# Patient Record
Sex: Female | Born: 1970 | Race: White | Hispanic: No | Marital: Married | Smoking: Never smoker
Health system: Southern US, Community
[De-identification: ages and names within clinical notes are randomized; demographics above are authoritative.]

## PROBLEM LIST (undated history)

## (undated) DIAGNOSIS — N809 Endometriosis, unspecified: Secondary | ICD-10-CM

---

## 2009-10-06 ENCOUNTER — Ambulatory Visit: Payer: Self-pay | Admitting: Internal Medicine

## 2017-08-12 ENCOUNTER — Emergency Department
Admission: EM | Admit: 2017-08-12 | Discharge: 2017-08-12 | Disposition: A | Discharge status: Home / Self Care | Payer: 59 | Attending: Emergency Medicine | Admitting: Emergency Medicine

## 2017-08-12 ENCOUNTER — Emergency Department: Payer: 59

## 2017-08-12 DIAGNOSIS — N809 Endometriosis, unspecified: Secondary | ICD-10-CM | POA: Insufficient documentation

## 2017-08-12 DIAGNOSIS — R109 Unspecified abdominal pain: Secondary | ICD-10-CM

## 2017-08-12 DIAGNOSIS — M545 Low back pain: Secondary | ICD-10-CM | POA: Diagnosis present

## 2017-08-12 DIAGNOSIS — R319 Hematuria, unspecified: Secondary | ICD-10-CM | POA: Insufficient documentation

## 2017-08-12 DIAGNOSIS — Q799 Congenital malformation of musculoskeletal system, unspecified: Secondary | ICD-10-CM | POA: Diagnosis not present

## 2017-08-12 HISTORY — DX: Endometriosis, unspecified: N80.9

## 2017-08-12 LAB — CBC
HEMATOCRIT: 40.6 % (ref 35.0–47.0)
HEMOGLOBIN: 14.4 g/dL (ref 12.0–16.0)
MCH: 30.4 pg (ref 26.0–34.0)
MCHC: 35.4 g/dL (ref 32.0–36.0)
MCV: 85.9 fL (ref 80.0–100.0)
Platelets: 237 10*3/uL (ref 150–440)
RBC: 4.73 MIL/uL (ref 3.80–5.20)
RDW: 12.7 % (ref 11.5–14.5)
WBC: 4.7 10*3/uL (ref 3.6–11.0)

## 2017-08-12 LAB — URINALYSIS, COMPLETE (UACMP) WITH MICROSCOPIC
BACTERIA UA: NONE SEEN
Bilirubin Urine: NEGATIVE
Glucose, UA: NEGATIVE mg/dL
KETONES UR: NEGATIVE mg/dL
Leukocytes, UA: NEGATIVE
Nitrite: NEGATIVE
Protein, ur: NEGATIVE mg/dL
SPECIFIC GRAVITY, URINE: 1.025 (ref 1.005–1.030)
pH: 5 (ref 5.0–8.0)

## 2017-08-12 LAB — BASIC METABOLIC PANEL
ANION GAP: 7 (ref 5–15)
BUN: 16 mg/dL (ref 6–20)
CO2: 27 mmol/L (ref 22–32)
Calcium: 9.3 mg/dL (ref 8.9–10.3)
Chloride: 103 mmol/L (ref 101–111)
Creatinine, Ser: 1.12 mg/dL — ABNORMAL HIGH (ref 0.44–1.00)
GFR calc Af Amer: >60 mL/min (ref >60)
GFR, EST NON AFRICAN AMERICAN: 58 mL/min — AB (ref >60)
GLUCOSE: 89 mg/dL (ref 65–99)
POTASSIUM: 4.6 mmol/L (ref 3.5–5.1)
Sodium: 137 mmol/L (ref 135–145)

## 2017-08-12 LAB — GLUCOSE, CAPILLARY: GLUCOSE-CAPILLARY: 90 mg/dL (ref 65–99)

## 2017-08-12 LAB — POCT PREGNANCY, URINE: Preg Test, Ur: NEGATIVE

## 2017-08-12 MED ORDER — KETOROLAC TROMETHAMINE 30 MG/ML IJ SOLN
30.0000 mg | Freq: Once | INTRAMUSCULAR | Status: AC
  Administered 2017-08-12: 30 mg via INTRAVENOUS
  Filled 2017-08-12: qty 1

## 2017-08-12 MED ORDER — IBUPROFEN 200 MG PO TABS: 600.0000 mg | ORAL_TABLET | Freq: Four times a day (QID) | ORAL | 0 refills | Status: DC | PRN

## 2017-08-12 MED ORDER — SODIUM CHLORIDE 0.9 % IV BOLUS (SEPSIS)
1000.0000 mL | Freq: Once | INTRAVENOUS | Status: AC
  Administered 2017-08-12: 1000 mL via INTRAVENOUS

## 2017-08-12 MED ORDER — POLYETHYLENE GLYCOL 3350 17 G PO PACK: 17.0000 g | PACK | Freq: Every day | ORAL | 0 refills | Status: DC

## 2017-08-12 MED ORDER — ONDANSETRON HCL 4 MG PO TABS: 4.0000 mg | ORAL_TABLET | Freq: Every day | ORAL | 0 refills | Status: DC | PRN

## 2017-08-12 MED ORDER — ONDANSETRON HCL 4 MG/2ML IJ SOLN
4.0000 mg | Freq: Once | INTRAMUSCULAR | Status: AC
  Administered 2017-08-12: 4 mg via INTRAVENOUS
  Filled 2017-08-12: qty 2

## 2017-08-12 MED ORDER — OXYCODONE-ACETAMINOPHEN 5-325 MG PO TABS: 1.0000 | ORAL_TABLET | Freq: Four times a day (QID) | ORAL | 0 refills | Status: AC | PRN

## 2017-08-12 NOTE — ED Triage Notes (Signed)
Pt states that she is having left sided mid back pain and right sided lower back pain X 2 days. No injury. Intermittent nausea and dizziness that started when the pain began. Blood in urine noted by patient. Pt alert and oriented X4, active, cooperative, pt in NAD. RR even and unlabored, color WNL.

## 2017-08-12 NOTE — ED Notes (Signed)
Patient transported to CT 

## 2017-08-12 NOTE — ED Provider Notes (Addendum)
[facility] Emergency Department Provider Note  ____________________________________________   I have reviewed the triage vital signs and the nursing notes.   HISTORY  Chief Complaint Back Pain    HPI Meagan Leach is a 46 y.o. female w a considerablet family history of renal colic, who also suffers from endometriosis complains of focal left-sided back pain associated with dry heaving "from the pain" which began over the last couple days gradually become much worse this morning. No fever. Had hematuria. Denies any vaginal discharge, is not on her menstrual period, denies any other associated symptoms. Pain is sharp, radiates around sometimes towards her groin but mostly stays just in the back. Denies abdominal pain. No other associated symptoms normal bowel movements. No prior treatments.      Past Medical History:  Diagnosis Date  . Endometriosis     There are no active problems to display for this patient.   History reviewed. No pertinent surgical history.  Prior to Admission medications   Not on File    Allergies Penicillins  No family history on file.  Social History Social History  Substance Use Topics  . Smoking status: Never Smoker  . Smokeless tobacco: Not on file  . Alcohol use No    Review of Systems Constitutional: No fever/chills Eyes: No visual changes. ENT: No sore throat. No stiff neck no neck pain Cardiovascular: Denies chest pain. Respiratory: Denies shortness of breath. Gastrointestinal:   plus "dry heaving from pain".  No diarrhea.  No constipation. Genitourinary: Negative for dysuria. Musculoskeletal: Negative lower extremity swelling Skin: Negative for rash. Neurological: Negative for severe headaches, focal weakness or numbness.   ____________________________________________   PHYSICAL EXAM:  VITAL SIGNS: ED Triage Vitals [08/12/17 1215]  Enc Vitals Group     BP 135/82     Pulse Rate 75     Resp 18      Temp 98.4 F (36.9 C)     Temp Source Oral     SpO2 98 %     Weight 135 lb (61.2 kg)     Height 5' (1.524 m)     Head Circumference      Peak Flow      Pain Score 6     Pain Loc      Pain Edu?      Excl. in [place]?     Constitutional: Alert and oriented. Well appearing and in no acute distress. Eyes: Conjunctivae are normal Head: Atraumatic HEENT: No congestion/rhinnorhea. Mucous membranes are moist.  Oropharynx non-erythematous Neck:   Nontender with no meningismus, no masses, no stridor Cardiovascular: Normal rate, regular rhythm. Grossly normal heart sounds.  Good peripheral circulation. Respiratory: Normal respiratory effort.  No retractions. Lungs CTAB. Abdominal: Soft and nontender. No distention. No guarding no rebound Back:  There is no focal tenderness or step off.  there is no midline tenderness there are no lesions noted. there is significant left CVA tenderness Musculoskeletal: No lower extremity tenderness, no upper extremity tenderness. No joint effusions, no DVT signs strong distal pulses no edema Neurologic:  Normal speech and language. No gross focal neurologic deficits are appreciated.  Skin:  Skin is warm, dry and intact. No rash noted. Psychiatric: Mood and affect are normal. Speech and behavior are normal.  ____________________________________________   LABS (all labs ordered are listed, but only abnormal results are displayed)  Labs Reviewed  BASIC METABOLIC PANEL - Abnormal; Notable for the following:       Result Value   Creatinine, Ser  1.12 (*)    GFR calc non Af Amer 58 (*)    All other components within normal limits  URINALYSIS, COMPLETE (UACMP) WITH MICROSCOPIC - Abnormal; Notable for the following:    Color, Urine YELLOW (*)    APPearance HAZY (*)    Hgb urine dipstick MODERATE (*)    Squamous Epithelial / LPF 0-5 (*)    All other components within normal limits  CBC  GLUCOSE, CAPILLARY  CBG MONITORING, ED  POC URINE PREG, ED  POCT  PREGNANCY, URINE    Pertinent labs  results that were available during my care of the patient were reviewed by me and considered in my medical decision making (see chart for details). ____________________________________________  EKG  I personally interpreted any EKGs ordered by me or triage  ____________________________________________  RADIOLOGY  Pertinent labs & imaging results that were available during my care of the patient were reviewed by me and considered in my medical decision making (see chart for details). If possible, patient and/or family made aware of any abnormal findings. ____________________________________________    PROCEDURES  Procedure(s) performed: None  Procedures  Critical Care performed: None  ____________________________________________   INITIAL IMPRESSION / ASSESSMENT AND PLAN / ED COURSE  Pertinent labs & imaging results that were available during my care of the patient were reviewed by me and considered in my medical decision making (see chart for details).  patient with hematuria, focal left flank pain which is significant, family history of kidney stones most likely this is a kidney stone differential does exist, don't feel ectopic pregnancy or ovarian cyst or likely given symptoms, pyelonephritis seems to be ruled out mostly by urinalysis and other findings but we will evaluate further with imaging   ----------------------------------------- 3:53 PM on 08/12/2017 -----------------------------------------  patient is pain-free after Toradol, she has hematuria, slightly increased creatinine, and flank pain all of which are consistent with kidney stone, she may have passed a small stone or she may have a stone 2 small see on CT but there is no other obvious pathology noted. Certainly no abdominal tenderness on serial exams. I did offer her pelvic exam which she declines. I don't think is unreasonable. She understands the limitations places upon  the workup. A shin was made aware of findings on CT suggesting at least unknown sclerotic pathology in the left elbow region and the need for outpatient MRI which she states she will pursue, she is laughing and joking in no acute distress extensive return precautions given and understood,  Made aware of stool burden on ct.     ____________________________________________   FINAL CLINICAL IMPRESSION(S) / ED DIAGNOSES  Final diagnoses:  None      This chart was dictated using voice recognition software.  Despite best efforts to proofread,  errors can occur which can change meaning.      Schuyler Amor, MD 08/12/17 1435    Schuyler Amor, MD 08/12/17 1554    Schuyler Amor, MD 08/12/17 [phone]

## 2017-08-12 NOTE — Discharge Instructions (Signed)
At this time, the most likely diagnosis is that you're either passing a small kidney stone or you have already passed one. No other significant pathology is noted. You have declined pelvic exam which is not unreasonable but it does limit my ability to evaluate you further. If you feel worse in any way including severe pain, vomiting, fever, or other concerns please return to the emergency department. We do noticed that there is some sclerosis of the bones in your pelvic region, is unclear exactly what caused this but we do ask you to follow closely as an outpatient with her primary care doctor for MRI. This should happen in the next few weeks I would think.please return to the emergency room for any concerning symptoms, do not drive on any sedating medication.

## 2018-10-24 IMAGING — CT CT RENAL STONE PROTOCOL
2 of 4 series · 16 of 46 positions shown, 18 images · non-contrast
Comparison: None.

CLINICAL DATA: Left-sided back pain

EXAM:
CT ABDOMEN AND PELVIS WITHOUT CONTRAST
TECHNIQUE: Multidetector CT imaging of the abdomen and pelvis was performed
following the standard protocol without IV contrast.

[Series 2: stone full standard · axial · 0.63mm/px · z∈[-958,-588]mm · 13 of 82 slices shown, 15 images]
[im 4/82  soft-tissue]
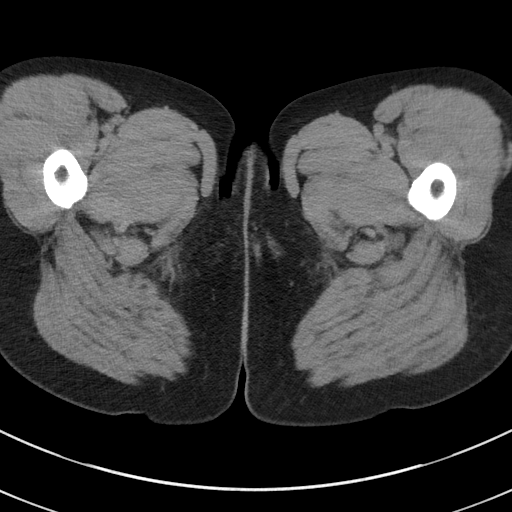
[im 4/82  bone]
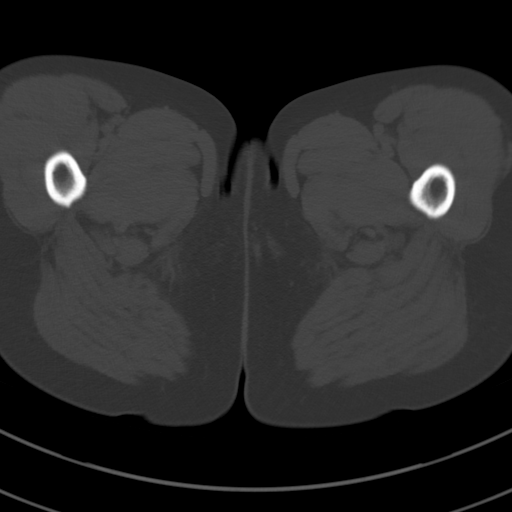
[im 10/82  soft-tissue]
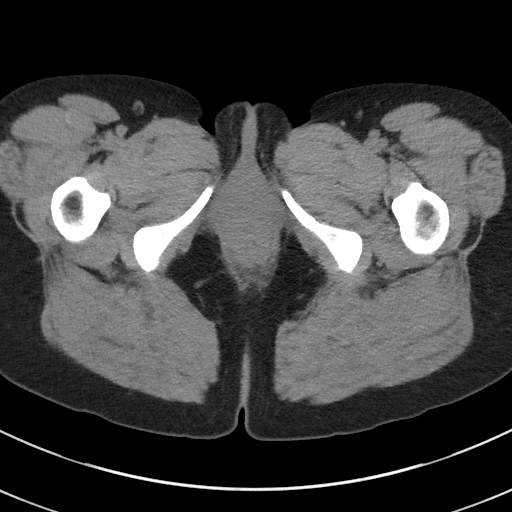
[im 17/82  soft-tissue]
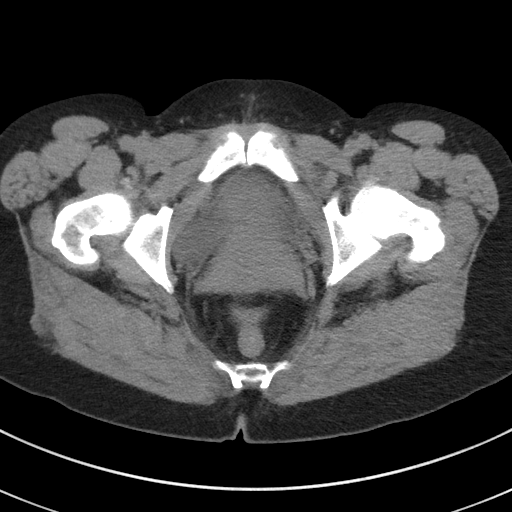
[im 23/82  soft-tissue]
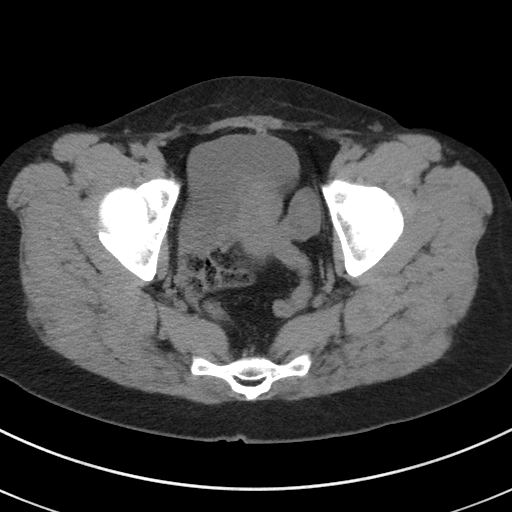
[im 30/82  soft-tissue]
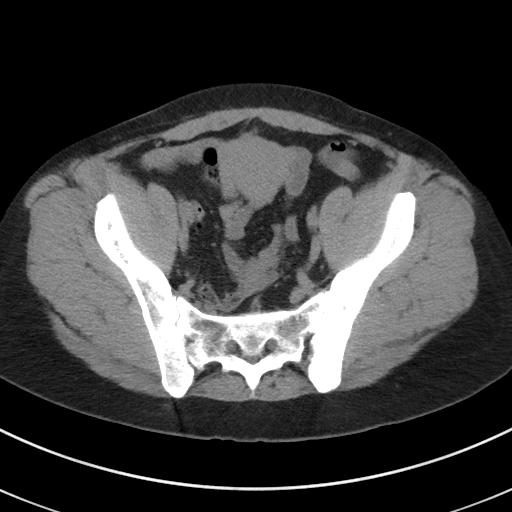
[im 36/82  soft-tissue]
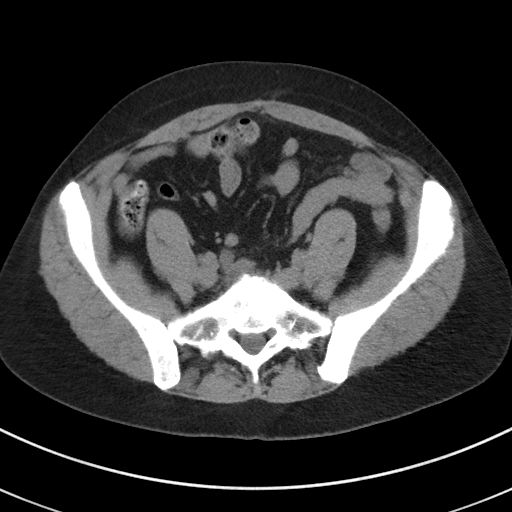
[im 43/82  soft-tissue]
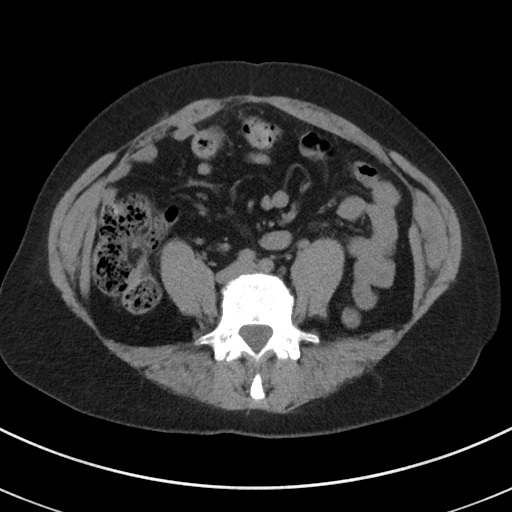
[im 46/82  soft-tissue]
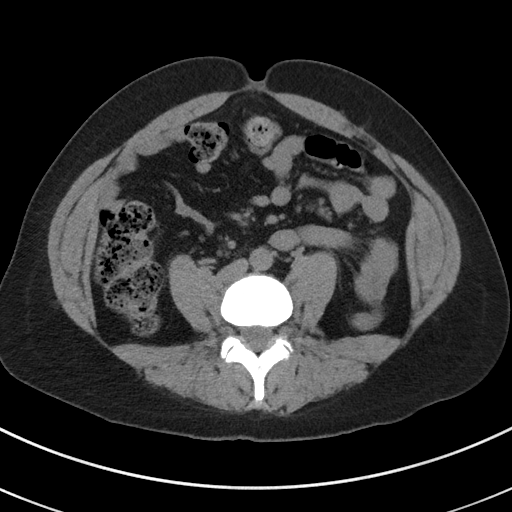
[im 52/82  soft-tissue]
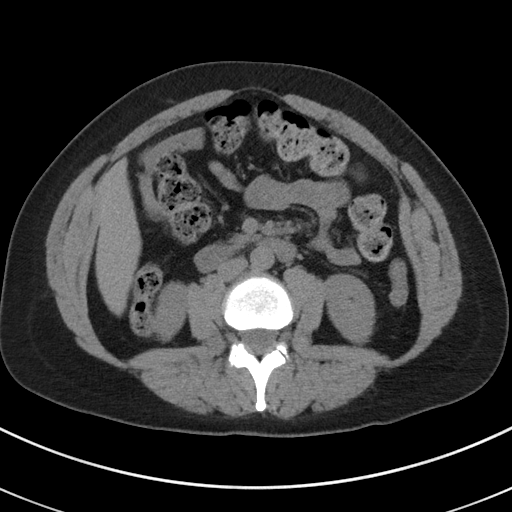
[im 52/82  bone]
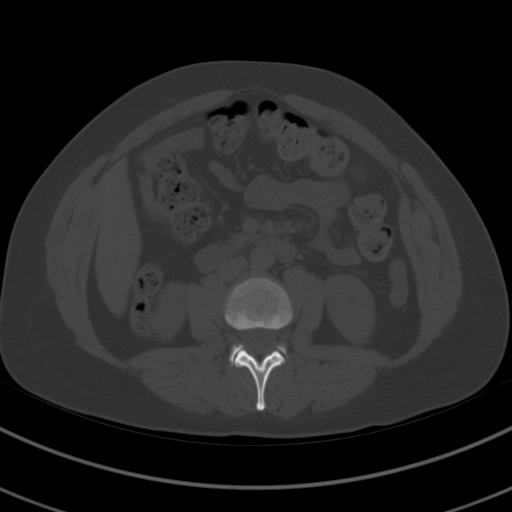
[im 59/82  soft-tissue]
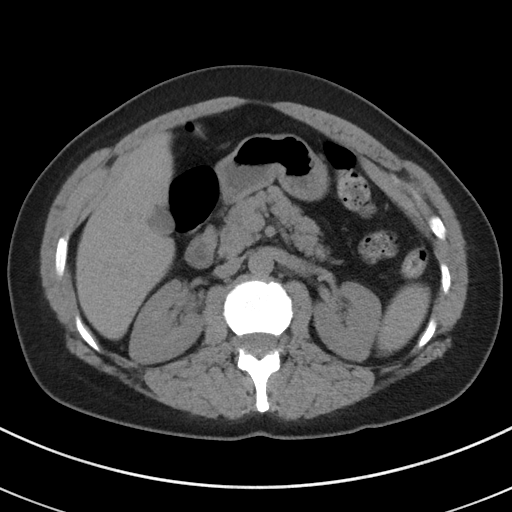
[im 65/82  soft-tissue]
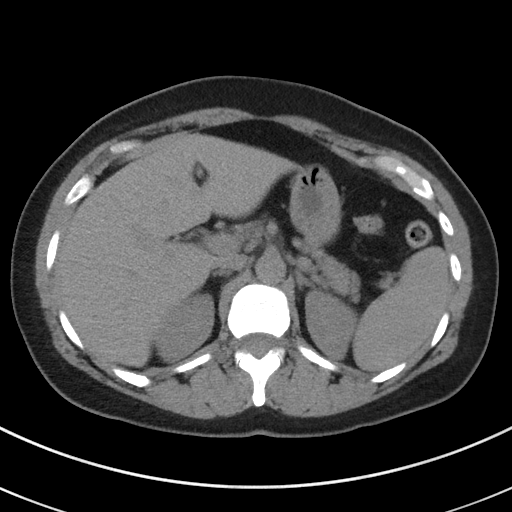
[im 72/82  soft-tissue]
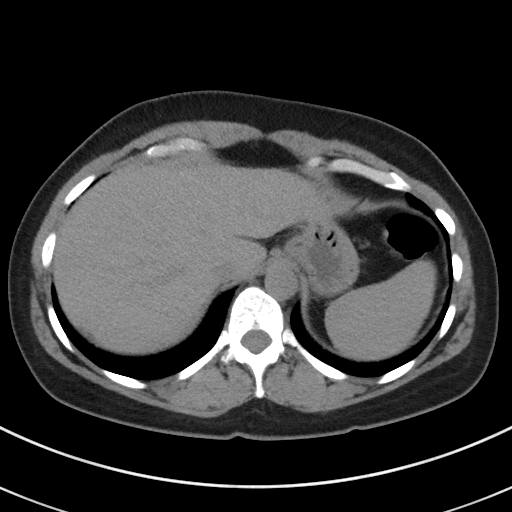
[im 78/82  soft-tissue]
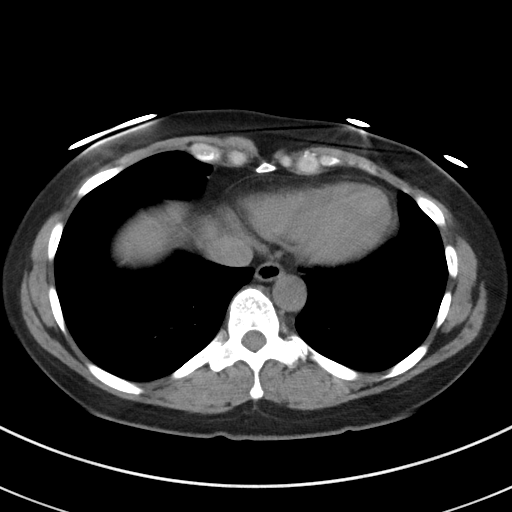

[Series 5: coronal · coronal · 0.61mm/px · 3 of 117 slices shown]
[im 39/117  soft-tissue]
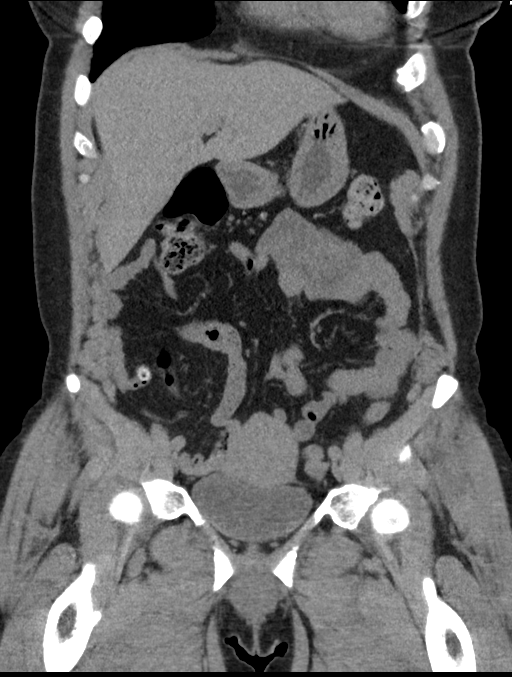
[im 52/117  soft-tissue]
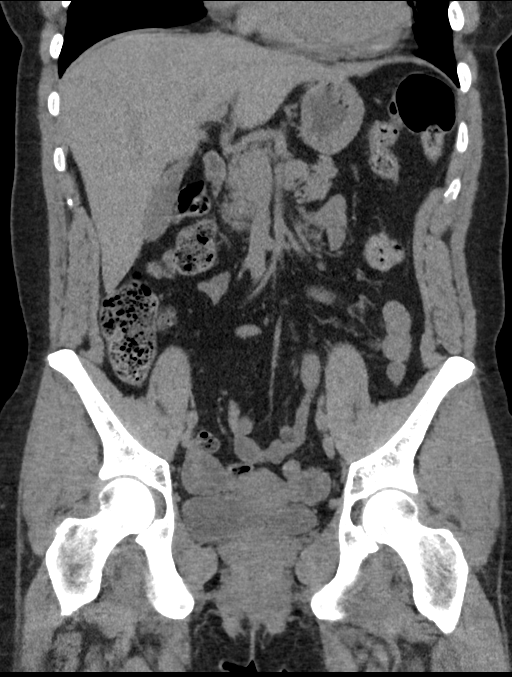
[im 65/117  soft-tissue]
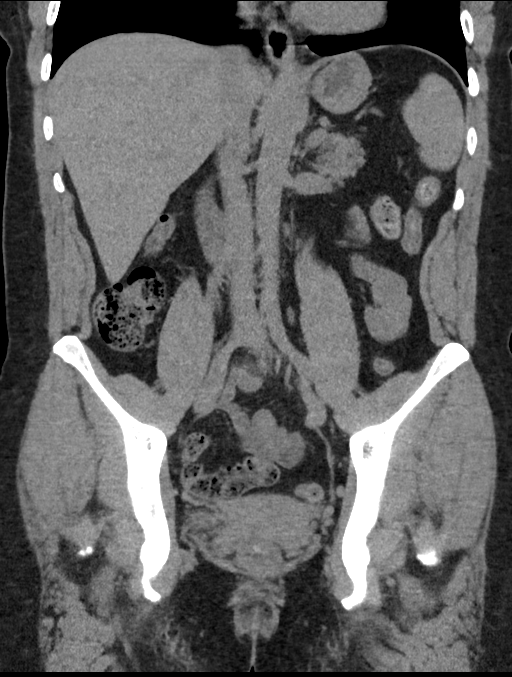

[16 of 46 positions shown; findings below may reference images not displayed]

FINDINGS: Lower chest: No acute abnormality.

Hepatobiliary: No focal liver abnormality is seen. No gallstones,
gallbladder wall thickening, or biliary dilatation.

Pancreas: Unremarkable. No pancreatic ductal dilatation or
surrounding inflammatory changes.

Spleen: Normal in size without focal abnormality.

Adrenals/Urinary Tract: Adrenal glands are unremarkable. Kidneys are
normal, without renal calculi, focal lesion, or hydronephrosis.
Bladder is unremarkable.

Stomach/Bowel: Stomach is within normal limits. Appendix appears
normal. No evidence of bowel wall thickening, distention, or
inflammatory changes.

Vascular/Lymphatic: No significant vascular findings are present. No
enlarged abdominal or pelvic lymph nodes.

Reproductive: Uterus and bilateral adnexa are unremarkable.

Other: No abdominal wall hernia or abnormality. No abdominopelvic
ascites.

Musculoskeletal: Straightening of the spine. Degenerative changes at
L5-S1. Asymmetric broad sclerosis within the left iliac bone
abutting the SI joint. Minimal sclerosis on the right No apparent
joint space narrowing. No sclerosis in the sacrum. No associated
soft tissue mass.
IMPRESSION: 1. Negative for nephrolithiasis, hydronephrosis, or ureteral stone.
2. There are no acute intra-abdominal or pelvic abnormalities
visualize.
3. Prominent asymmetric sclerosis of the left iliac bone.
Differential considerations include Paget's disease, slight atypical
appearance of osteitis condensans ilii, less likely asymmetric
sacroiliitis. Metastatic disease felt less likely given absence of
associated soft tissue mass. Could obtain further evaluation with
MRI or bone scan if there is corresponding history of malignancy. I

## 2019-01-17 ENCOUNTER — Encounter: Payer: Self-pay | Admitting: Emergency Medicine

## 2019-01-17 ENCOUNTER — Emergency Department
Admission: EM | Admit: 2019-01-17 | Discharge: 2019-01-17 | Disposition: A | Discharge status: Home / Self Care | Payer: 59 | Attending: Emergency Medicine | Admitting: Emergency Medicine

## 2019-01-17 ENCOUNTER — Other Ambulatory Visit: Payer: Self-pay

## 2019-01-17 ENCOUNTER — Emergency Department: Payer: 59

## 2019-01-17 DIAGNOSIS — Z880 Allergy status to penicillin: Secondary | ICD-10-CM | POA: Insufficient documentation

## 2019-01-17 DIAGNOSIS — Z79899 Other long term (current) drug therapy: Secondary | ICD-10-CM | POA: Diagnosis not present

## 2019-01-17 DIAGNOSIS — R110 Nausea: Secondary | ICD-10-CM | POA: Insufficient documentation

## 2019-01-17 DIAGNOSIS — Z8742 Personal history of other diseases of the female genital tract: Secondary | ICD-10-CM | POA: Diagnosis not present

## 2019-01-17 DIAGNOSIS — Z87442 Personal history of urinary calculi: Secondary | ICD-10-CM | POA: Diagnosis not present

## 2019-01-17 DIAGNOSIS — M545 Low back pain: Secondary | ICD-10-CM | POA: Diagnosis present

## 2019-01-17 DIAGNOSIS — M5441 Lumbago with sciatica, right side: Secondary | ICD-10-CM | POA: Diagnosis not present

## 2019-01-17 LAB — URINALYSIS, COMPLETE (UACMP) WITH MICROSCOPIC
BILIRUBIN URINE: NEGATIVE
Bacteria, UA: NONE SEEN
Glucose, UA: NEGATIVE mg/dL
HGB URINE DIPSTICK: NEGATIVE
KETONES UR: NEGATIVE mg/dL
LEUKOCYTE UA: NEGATIVE
Nitrite: NEGATIVE
PH: 6 (ref 5.0–8.0)
Protein, ur: NEGATIVE mg/dL
Specific Gravity, Urine: 1.015 (ref 1.005–1.030)

## 2019-01-17 LAB — PREGNANCY, URINE: Preg Test, Ur: NEGATIVE

## 2019-01-17 MED ORDER — KETOROLAC TROMETHAMINE 30 MG/ML IJ SOLN
30.0000 mg | Freq: Once | INTRAMUSCULAR | Status: AC
  Administered 2019-01-17: 30 mg via INTRAVENOUS
  Filled 2019-01-17: qty 1

## 2019-01-17 MED ORDER — ONDANSETRON HCL 4 MG/2ML IJ SOLN
4.0000 mg | Freq: Once | INTRAMUSCULAR | Status: AC
  Administered 2019-01-17: 4 mg via INTRAVENOUS
  Filled 2019-01-17: qty 2

## 2019-01-17 MED ORDER — ONDANSETRON 4 MG PO TBDP
4.0000 mg | ORAL_TABLET | Freq: Once | ORAL | Status: AC
  Administered 2019-01-17: 4 mg via ORAL
  Filled 2019-01-17: qty 1

## 2019-01-17 MED ORDER — METHOCARBAMOL 500 MG PO TABS: ORAL_TABLET | ORAL | 0 refills | Status: DC

## 2019-01-17 MED ORDER — METOCLOPRAMIDE HCL 10 MG PO TABS
10.0000 mg | ORAL_TABLET | Freq: Three times a day (TID) | ORAL | 1 refills | Status: AC
Start: 2019-01-17 — End: 2020-01-17

## 2019-01-17 MED ORDER — ETODOLAC 400 MG PO TABS: 400.0000 mg | ORAL_TABLET | Freq: Two times a day (BID) | ORAL | 0 refills | Status: DC

## 2019-01-17 NOTE — ED Notes (Addendum)
See triage note  Presents with right sided lower back pain  Pain started about 1 week ago w/o injury  States pain is non radiating and denies any urinary sxs'  States she has been using IBU w.o relief

## 2019-01-17 NOTE — ED Notes (Signed)
conts to be nauseated   Additional meds given

## 2019-01-17 NOTE — ED Triage Notes (Signed)
Pt reports right lower back pain into right buttock x 1 week; denies urinary s/s

## 2019-01-17 NOTE — Discharge Instructions (Signed)
Follow-up with your primary care provider.  Begin taking medication only as directed.  Also consider physical therapy however your PCP will need to refer you for a evaluation.  If not improving you may be referred to Dr. Roland Rack who is on-call for orthopedics for congenital clinic.

## 2019-01-17 NOTE — ED Provider Notes (Addendum)
[facility] Emergency Department Provider Note  ____________________________________________   First MD Initiated Contact with Patient 01/17/19 [phone]     (approximate)  I have reviewed the triage vital signs and the nursing notes.   HISTORY  Chief Complaint Back Pain   HPI Meagan Leach is a 48 y.o. female presents to the ED with complaint of low back pain on the right with some radiation.  Patient states that she has been told in the past that she has sciatica.  Mother also is present with the patient states that both she and father have a history of kidney stones and that she is concerned that this is really what is going on.  Patient denies any history of recent injury.  She denies any incontinence of bowel or bladder or saddle anesthesias.  She has infrequently taken over-the-counter medication without any relief.  While in the ED she developed nausea but no vomiting.  She denies any fever, chills, diarrhea with this.         Past Medical History:  Diagnosis Date  . Endometriosis     There are no active problems to display for this patient.   History reviewed. No pertinent surgical history.  Prior to Admission medications   Medication Sig Start Date End Date Taking? Authorizing Provider  ibuprofen (MOTRIN IB) 200 MG tablet Take 3 tablets (600 mg total) by mouth every 6 (six) hours as needed. 08/12/17  Yes Schuyler Amor, MD  spironolactone (ALDACTONE) 50 MG tablet Take 50 mg by mouth 2 (two) times daily.   Yes [provider]  etodolac (LODINE) 400 MG tablet Take 1 tablet (400 mg total) by mouth 2 (two) times daily. 01/17/19   Johnn Hai, PA-C  methocarbamol (ROBAXIN) 500 MG tablet 1-2 tablets at bedtime for musles 01/17/19   Johnn Hai, PA-C  metoCLOPramide (REGLAN) 10 MG tablet Take 1 tablet (10 mg total) by mouth 4 (four) times daily -  before meals and at bedtime. 01/17/19 01/17/20  Johnn Hai, PA-C     Allergies Penicillins  History reviewed. No pertinent family history.  Social History Social History   Tobacco Use  . Smoking status: Never Smoker  . Smokeless tobacco: Never Used  Substance Use Topics  . Alcohol use: Yes    Comment: seldom  . Drug use: Never    Review of Systems Constitutional: No fever/chills Eyes: No visual changes. ENT: No sore throat. Cardiovascular: Denies chest pain. Respiratory: Denies shortness of breath. Gastrointestinal: No abdominal pain.  No nausea, no vomiting.  No diarrhea.  No constipation. Genitourinary: Negative for dysuria. Musculoskeletal: Negative for back pain. Skin: Negative for rash. Neurological: Negative for headaches, focal weakness or numbness. ____________________________________________   PHYSICAL EXAM:  VITAL SIGNS: ED Triage Vitals  Enc Vitals Group     BP 01/17/19 0622 135/84     Pulse Rate 01/17/19 0622 76     Resp 01/17/19 0622 16     Temp 01/17/19 0622 98 F (36.7 C)     Temp Source 01/17/19 0622 Oral     SpO2 01/17/19 0622 100 %     Weight 01/17/19 0624 145 lb (65.8 kg)     Height 01/17/19 0624 5\' 1"  (1.549 m)     Head Circumference --      Peak Flow --      Pain Score 01/17/19 0623 10     Pain Loc --      Pain Edu? --  Excl. in [place]? --    Constitutional: Alert and oriented. Well appearing and in no acute distress. Eyes: Conjunctivae are normal.  Head: Atraumatic. Neck: No stridor.   Cardiovascular: Normal rate, regular rhythm. Grossly normal heart sounds.  Good peripheral circulation. Respiratory: Normal respiratory effort.  No retractions. Lungs CTAB. Gastrointestinal: Soft and nontender. No distention. No CVA tenderness. Musculoskeletal: No gross deformities noted of the lower back but moderate tenderness on palpation of the lower LS-spine and right SI paravertebral muscles are noted.  Range of motion is slow and guarded.  Straight leg raises are negative.  Good muscle strength  bilaterally. Neurologic:  Normal speech and language. No gross focal neurologic deficits are appreciated.  Reflexes are 2+ bilaterally.  No gait instability. Skin:  Skin is warm, dry and intact. No rash noted. Psychiatric: Mood and affect are normal. Speech and behavior are normal.  ____________________________________________   LABS (all labs ordered are listed, but only abnormal results are displayed)  Labs Reviewed  URINALYSIS, COMPLETE (UACMP) WITH MICROSCOPIC - Abnormal; Notable for the following components:      Result Value   Color, Urine YELLOW (*)    APPearance CLEAR (*)    All other components within normal limits  PREGNANCY, URINE  POC URINE PREG, ED   ____________________________________________   RADIOLOGY   Official radiology report(s): Ct Renal Stone Study  Result Date: 01/17/2019 CLINICAL DATA:  Right-sided low back pain. EXAM: CT ABDOMEN AND PELVIS WITHOUT CONTRAST TECHNIQUE: Multidetector CT imaging of the abdomen and pelvis was performed following the standard protocol without IV contrast. COMPARISON:  Body CT 08/12/2017 FINDINGS: Lower chest: No acute abnormality. Hepatobiliary: No focal liver abnormality is seen. No gallstones, gallbladder wall thickening, or biliary dilatation. Pancreas: Unremarkable. No pancreatic ductal dilatation or surrounding inflammatory changes. Spleen: Normal in size without focal abnormality. Adrenals/Urinary Tract: Adrenal glands are unremarkable. Kidneys are normal, without renal calculi, focal lesion, or hydronephrosis. Bladder is unremarkable. Stomach/Bowel: Stomach is within normal limits. Appendix appears normal. No evidence of bowel wall thickening, distention, or inflammatory changes. Vascular/Lymphatic: No significant vascular findings are present. No enlarged abdominal or pelvic lymph nodes. Reproductive: Uterus and bilateral adnexa are unremarkable. Other: No abdominal wall hernia or abnormality. No abdominopelvic ascites.  Musculoskeletal: L5-S1 spondylosis. Again seen is asymmetric broad prominent sclerotic appearance of the left iliac bone abutting the SI joint. Minimal sclerosis along the right SI joint within the right ilium. IMPRESSION: 1. No evidence of acute abnormality within the solid abdominal organs. 2. No evidence of obstructive uropathy. 3. Persistent prominent asymmetric sclerosis of the left iliac bone. Differential diagnosis includes Paget's disease, atypical osteitis condensans ilii, or potentially metastatic disease. Electronically Signed   By: Fidela Salisbury M.D.   On: 01/17/2019 09:53    ____________________________________________   PROCEDURES  Procedure(s) performed (including Critical Care):  Procedures   ____________________________________________   INITIAL IMPRESSION / ASSESSMENT AND PLAN / ED COURSE  Patient presents to the ED with complaint of right sided low back pain with radiation into her right leg for approximately 1 week.  There is been no history of injury and no urinary symptoms.  Patient denies any previous history of kidney stones however mother is present and states that both she, father and brother have kidney stones and she is concerned that this might be the cause of her daughters pain.  Physical exam is more consistent with low back pain with right sciatica.  CT renal study was negative for stones.  Patient was given IV Zofran and Toradol  while in the ED.  Patient states that Zofran has never helped with her nausea even when she was pregnant years ago.  Patient was given a prescription for Reglan to try for nausea, etodolac 400 mg twice daily for inflammation and Robaxin as needed for muscle spasms.  She is encouraged to use ice or heat to her back as needed for discomfort.  She is to follow-up with her PCP or Dr. Roland Rack as needed for back pain.  ____________________________________________   FINAL CLINICAL IMPRESSION(S) / ED DIAGNOSES  Final diagnoses:  Acute  right-sided low back pain with right-sided sciatica     ED Discharge Orders         Ordered    etodolac (LODINE) 400 MG tablet  2 times daily,   Status:  Discontinued     01/17/19 1016    methocarbamol (ROBAXIN) 500 MG tablet     01/17/19 1016    metoCLOPramide (REGLAN) 10 MG tablet  3 times daily before meals & bedtime     01/17/19 1016    etodolac (LODINE) 400 MG tablet  2 times daily     01/17/19 1018           Note:  This document was prepared using Dragon voice recognition software and may include unintentional dictation errors.    Johnn Hai, PA-C 01/17/19 1644    Johnn Hai, PA-C 01/17/19 1645    Earleen Newport, MD 01/18/19 [phone]

## 2019-02-16 ENCOUNTER — Other Ambulatory Visit: Payer: Self-pay | Admitting: Orthopedic Surgery

## 2019-02-16 DIAGNOSIS — M545 Low back pain, unspecified: Secondary | ICD-10-CM

## 2019-02-16 DIAGNOSIS — M5136 Other intervertebral disc degeneration, lumbar region: Secondary | ICD-10-CM

## 2019-02-16 DIAGNOSIS — M51369 Other intervertebral disc degeneration, lumbar region without mention of lumbar back pain or lower extremity pain: Secondary | ICD-10-CM

## 2019-02-16 DIAGNOSIS — M5442 Lumbago with sciatica, left side: Principal | ICD-10-CM

## 2019-02-16 DIAGNOSIS — M5137 Other intervertebral disc degeneration, lumbosacral region: Secondary | ICD-10-CM

## 2019-02-16 DIAGNOSIS — G8929 Other chronic pain: Secondary | ICD-10-CM

## 2019-02-16 DIAGNOSIS — M5441 Lumbago with sciatica, right side: Principal | ICD-10-CM

## 2019-02-16 DIAGNOSIS — M51379 Other intervertebral disc degeneration, lumbosacral region without mention of lumbar back pain or lower extremity pain: Secondary | ICD-10-CM

## 2019-03-22 ENCOUNTER — Ambulatory Visit: Admission: RE | Admit: 2019-03-22 | Payer: 59 | Source: Ambulatory Visit

## 2020-05-08 ENCOUNTER — Other Ambulatory Visit: Payer: Self-pay | Admitting: Dermatology

## 2020-05-08 ENCOUNTER — Ambulatory Visit: Payer: 59 | Admitting: Dermatology

## 2020-05-15 ENCOUNTER — Other Ambulatory Visit: Payer: Self-pay

## 2020-05-15 ENCOUNTER — Ambulatory Visit: Payer: 59 | Admitting: Dermatology

## 2020-05-15 ENCOUNTER — Encounter: Payer: Self-pay | Admitting: Dermatology

## 2020-05-15 VITALS — BP 108/73 | HR 67

## 2020-05-15 DIAGNOSIS — L700 Acne vulgaris: Secondary | ICD-10-CM

## 2020-05-15 MED ORDER — SPIRONOLACTONE 100 MG PO TABS: 100.0000 mg | ORAL_TABLET | Freq: Every day | ORAL | 3 refills | Status: DC

## 2020-05-15 NOTE — Patient Instructions (Signed)
Recommend daily broad spectrum sunscreen SPF 30+ to sun-exposed areas, reapply every 2 hours as needed. Call for new or changing lesions.  Spironolactone can cause increased urination and cause blood pressure to decrease. Please watch for signs of lightheadedness and be cautious when changing position. It can sometimes cause breast tenderness or an irregular period in premenopausal women. It can also increase potassium. The increase in potassium usually is not a concern unless you are taking other medicines that also increase potassium, so please be sure your doctor knows all of the other medications you are taking. This medication should not be taken  by pregnant women.  Benzoyl peroxide can cause dryness and irritation of the skin. It can also bleach fabric. When used together with Aczone (dapsone) cream, it can stain the skin orange.

## 2020-05-15 NOTE — Progress Notes (Signed)
   Follow-Up Visit   Subjective  Meagan Leach is a 49 y.o. female who presents for the following: Follow-up.  Patient presents today for follow up on OV 11/09/19 for Acne, patient is using Spironolactone 100 mg QD and Clinda/BP gel QD., No break out noted at this time. Patient has no side effects to spironolactone, has occasional breakouts around menses along jaw   The following portions of the chart were reviewed this encounter and updated as appropriate:      Review of Systems:  No other skin or systemic complaints except as noted in HPI or Assessment and Plan.  Objective  Well appearing patient in no apparent distress; mood and affect are within normal limits.  A focused examination was performed including Face. Relevant physical exam findings are noted in the Assessment and Plan.  Objective  Face, jaw: Clear today BP 108/73 P 67   Assessment & Plan  Acne vulgaris Face, jaw  Well-Controlled  Continue Spironolactone 100 mg qd Continue Clinda/bpo gel qd prn flares  Spironolactone can cause increased urination and cause blood pressure to decrease. Please watch for signs of lightheadedness and be cautious when changing position. It can sometimes cause breast tenderness or an irregular period in premenopausal women. It can also increase potassium. The increase in potassium usually is not a concern unless you are taking other medicines that also increase potassium, so please be sure your doctor knows all of the other medications you are taking. This medication should not be taken  by pregnant women.     Return in about 6 months (around 11/15/2020) for Acne.  Marene Lenz, CMA, am acting as scribe for Brendolyn Patty, MD .  Documentation: I have reviewed the above documentation for accuracy and completeness, and I agree with the above.  Brendolyn Patty MD

## 2020-11-27 ENCOUNTER — Ambulatory Visit: Payer: No Typology Code available for payment source | Admitting: Dermatology

## 2020-12-11 ENCOUNTER — Other Ambulatory Visit: Payer: Self-pay

## 2020-12-11 ENCOUNTER — Ambulatory Visit: Payer: No Typology Code available for payment source | Admitting: Dermatology

## 2020-12-11 DIAGNOSIS — I781 Nevus, non-neoplastic: Secondary | ICD-10-CM | POA: Diagnosis not present

## 2020-12-11 DIAGNOSIS — L700 Acne vulgaris: Secondary | ICD-10-CM

## 2020-12-11 MED ORDER — SPIRONOLACTONE 100 MG PO TABS: 100.0000 mg | ORAL_TABLET | Freq: Every day | ORAL | 5 refills | Status: DC

## 2020-12-11 MED ORDER — CLINDAMYCIN PHOS-BENZOYL PEROX 1-5 % EX GEL: CUTANEOUS | 6 refills | Status: DC

## 2020-12-11 NOTE — Progress Notes (Signed)
   Follow-Up Visit   Subjective  Meagan Leach is a 50 y.o. female who presents for the following: Acne (Face, 4m f/u Spironolactone 100mg  1 po qd, Benzaclin gel qd) and check spot (L upper lip, hx of bleeding when she blows her nose).  Acne doing well, no flares.  No side effects from meds.   The following portions of the chart were reviewed this encounter and updated as appropriate:       Review of Systems:  No other skin or systemic complaints except as noted in HPI or Assessment and Plan.  Objective  Well appearing patient in no apparent distress; mood and affect are within normal limits.  A focused examination was performed including face. Relevant physical exam findings are noted in the Assessment and Plan.  Objective  face: Face clear today  BP 138/88  Objective  L upper lip, L perioral: Blanching pink macules   Assessment & Plan  Acne vulgaris face  Chronic- controlled  Cont Spironolactone 100mg  1 po qd Cont Benzaclin gel qd  Spironolactone can cause increased urination and cause blood pressure to decrease. Please watch for signs of lightheadedness and be cautious when changing position. It can sometimes cause breast tenderness or an irregular period in premenopausal women. It can also increase potassium. The increase in potassium usually is not a concern unless you are taking other medicines that also increase potassium, so please be sure your doctor knows all of the other medications you are taking. This medication should not be taken by pregnant women.  This medicine should also not be taken together with sulfa drugs like Bactrim (trimethoprim/sulfamethexazole).    Benzoyl peroxide can cause dryness and irritation of the skin. It can also bleach fabric. When used together with Aczone (dapsone) cream, it can stain the skin orange.   spironolactone (ALDACTONE) 100 MG tablet - face  clindamycin-benzoyl peroxide (BENZACLIN) gel - face  Telangiectasia L upper  lip, L perioral  Benign, observe  Discussed BBL $200 per txt session vs ED $60 for first lesion and $15 for each additional lesion treated on same day.  Small risk of scar with ED.  May take more than one tx to clear.  Return in about 6 months (around 06/10/2021) for Acne f/u.  I, Othelia Pulling, RMA, am acting as scribe for Brendolyn Patty, MD . Documentation: I have reviewed the above documentation for accuracy and completeness, and I agree with the above.  Brendolyn Patty MD

## 2021-06-11 ENCOUNTER — Other Ambulatory Visit: Payer: Self-pay

## 2021-06-11 ENCOUNTER — Ambulatory Visit: Payer: No Typology Code available for payment source | Admitting: Dermatology

## 2021-06-11 DIAGNOSIS — L700 Acne vulgaris: Secondary | ICD-10-CM | POA: Diagnosis not present

## 2021-06-11 MED ORDER — SPIRONOLACTONE 100 MG PO TABS: 100.0000 mg | ORAL_TABLET | Freq: Every day | ORAL | 1 refills | Status: DC

## 2021-06-11 NOTE — Progress Notes (Signed)
   Follow-Up Visit   Subjective  Meagan Leach is a 50 y.o. female who presents for the following: Acne (Patient here today for 6 month acne follow up. Patient currently taking spironolactone '100mg'$  daily and using benzaclin nightly. Patient advises acne has improved and she is tolerating medications well. ). No side effects that she has noticed.  Minimal acne flares, currently clear.  Patient accompanied by daughter.   The following portions of the chart were reviewed this encounter and updated as appropriate:       Review of Systems:  No other skin or systemic complaints except as noted in HPI or Assessment and Plan.  Objective  Well appearing patient in no apparent distress; mood and affect are within normal limits.  A focused examination was performed including face, neck, chest and back. Relevant physical exam findings are noted in the Assessment and Plan.  Head - Anterior (Face) Clear today BP 104/79   Assessment & Plan  Acne vulgaris Head - Anterior (Face)  Cystic- well-controlled on spironolactone  Continue spironolactone '100mg'$  once daily, 90 day supply sent in with 1 rf Continue benzaclin nightly as needed  Spironolactone can cause increased urination and cause blood pressure to decrease. Please watch for signs of lightheadedness and be cautious when changing position. It can sometimes cause breast tenderness or an irregular period in premenopausal women. It can also increase potassium. The increase in potassium usually is not a concern unless you are taking other medicines that also increase potassium, so please be sure your doctor knows all of the other medications you are taking. This medication should not be taken by pregnant women.  This medicine should also not be taken together with sulfa drugs like Bactrim (trimethoprim/sulfamethexazole).   Benzoyl peroxide can cause dryness and irritation of the skin. It can also bleach fabric. When used together with Aczone  (dapsone) cream, it can stain the skin orange.   Related Medications clindamycin-benzoyl peroxide (BENZACLIN) gel Apply topically as directed. Qd to bid to face for acne  spironolactone (ALDACTONE) 100 MG tablet Take 1 tablet (100 mg total) by mouth daily.  Return in about 6 months (around 12/12/2021) for Acne.  Graciella Belton, RMA, am acting as scribe for Brendolyn Patty, MD .  Documentation: I have reviewed the above documentation for accuracy and completeness, and I agree with the above.  Brendolyn Patty MD

## 2021-06-11 NOTE — Patient Instructions (Signed)
Spironolactone can cause increased urination and cause blood pressure to decrease. Please watch for signs of lightheadedness and be cautious when changing position. It can sometimes cause breast tenderness or an irregular period in premenopausal women. It can also increase potassium. The increase in potassium usually is not a concern unless you are taking other medicines that also increase potassium, so please be sure your doctor knows all of the other medications you are taking. This medication should not be taken by pregnant women.  This medicine should also not be taken together with sulfa drugs like Bactrim (trimethoprim/sulfamethexazole).   Benzoyl peroxide can cause dryness and irritation of the skin. It can also bleach fabric. When used together with Aczone (dapsone) cream, it can stain the skin orange.

## 2021-12-31 ENCOUNTER — Ambulatory Visit: Payer: No Typology Code available for payment source | Admitting: Dermatology

## 2021-12-31 ENCOUNTER — Other Ambulatory Visit: Payer: Self-pay

## 2021-12-31 DIAGNOSIS — L821 Other seborrheic keratosis: Secondary | ICD-10-CM | POA: Diagnosis not present

## 2021-12-31 DIAGNOSIS — D2372 Other benign neoplasm of skin of left lower limb, including hip: Secondary | ICD-10-CM | POA: Diagnosis not present

## 2021-12-31 DIAGNOSIS — L700 Acne vulgaris: Secondary | ICD-10-CM | POA: Diagnosis not present

## 2021-12-31 DIAGNOSIS — D489 Neoplasm of uncertain behavior, unspecified: Secondary | ICD-10-CM

## 2021-12-31 MED ORDER — SPIRONOLACTONE 100 MG PO TABS: 100.0000 mg | ORAL_TABLET | Freq: Every day | ORAL | 1 refills | Status: DC

## 2021-12-31 NOTE — Progress Notes (Signed)
° °  Follow-Up Visit   Subjective  Meagan Leach is a 51 y.o. female who presents for the following: Follow-up (Patient here today for acne follow up. Currently on spironolactone and clindamycin gel. Patient reports no side effects and no new breakouts. Patient reports a spot at the left lower leg that is irritated when shaving. ).   The following portions of the chart were reviewed this encounter and updated as appropriate:      Review of Systems: No other skin or systemic complaints except as noted in HPI or Assessment and Plan.   Objective  Well appearing patient in no apparent distress; mood and affect are within normal limits.  A focused examination was performed including face, left lower leg. Relevant physical exam findings are noted in the Assessment and Plan.  face Clear today at exam   left medial lower leg 5 mm pink flesh firm nodule         Assessment & Plan  Acne vulgaris face  Chronic condition with duration or expected duration over one year. Currently well-controlled.   BP today 120/83  Continue spironolactone 100 mg tablet by mouth daily 6 rfs patient request 90 days 3 refill   Continue clindamycin - benzoyl peroxide (Benzaclin) gel as directed   Spironolactone can cause increased urination and cause blood pressure to decrease. Please watch for signs of lightheadedness and be cautious when changing position. It can sometimes cause breast tenderness or an irregular period in premenopausal women. It can also increase potassium. The increase in potassium usually is not a concern unless you are taking other medicines that also increase potassium, so please be sure your doctor knows all of the other medications you are taking. This medication should not be taken by pregnant women.  This medicine should also not be taken together with sulfa drugs like Bactrim (trimethoprim/sulfamethexazole).    spironolactone (ALDACTONE) 100 MG tablet - face Take 1 tablet  (100 mg total) by mouth daily.  Related Medications clindamycin-benzoyl peroxide (BENZACLIN) gel Apply topically as directed. Qd to bid to face for acne  Neoplasm of uncertain behavior left medial lower leg  Epidermal / dermal shaving  Lesion diameter (cm):  0.5 Informed consent: discussed and consent obtained   Patient was prepped and draped in usual sterile fashion: Area prepped with alcohol. Anesthesia: the lesion was anesthetized in a standard fashion   Anesthetic:  1% lidocaine w/ epinephrine 1-100,000 buffered w/ 8.4% NaHCO3 Instrument used: flexible razor blade   Hemostasis achieved with: pressure, aluminum chloride and electrodesiccation   Outcome: patient tolerated procedure well   Post-procedure details: wound care instructions given   Post-procedure details comment:  Ointment and small bandage applied.   Specimen 1 - Surgical pathology Differential Diagnosis: Irritated dermatofibroma vs other  Check Margins: No  Irritated dermatofibroma vs other   Seborrheic Keratoses - Stuck-on, waxy, tan-brown papule R lower leg - Benign-appearing - Discussed benign etiology and prognosis. - Observe - Call for any changes  Return for 1 year acne follow up. I, Ruthell Rummage, CMA, am acting as scribe for Brendolyn Patty, MD.  Documentation: I have reviewed the above documentation for accuracy and completeness, and I agree with the above.  Brendolyn Patty MD

## 2021-12-31 NOTE — Patient Instructions (Addendum)
Biopsy Wound Care Instructions  Leave the original bandage on for 24 hours if possible.  If the bandage becomes soaked or soiled before that time, it is OK to remove it and examine the wound.  A small amount of post-operative bleeding is normal.  If excessive bleeding occurs, remove the bandage, place gauze over the site and apply continuous pressure (no peeking) over the area for 30 minutes. If this does not work, please call our clinic as soon as possible or page your doctor if it is after hours.   Once a day, cleanse the wound with soap and water. It is fine to shower. If a thick crust develops you may use a Q-tip dipped into dilute hydrogen peroxide (mix 1:1 with water) to dissolve it.  Hydrogen peroxide can slow the healing process, so use it only as needed.    After washing, apply petroleum jelly (Vaseline) or an antibiotic ointment if your doctor prescribed one for you, followed by a bandage.    For best healing, the wound should be covered with a layer of ointment at all times. If you are not able to keep the area covered with a bandage to hold the ointment in place, this may mean re-applying the ointment several times a day.  Continue this wound care until the wound has healed and is no longer open.   Itching and mild discomfort is normal during the healing process. However, if you develop pain or severe itching, please call our office.   If you have any discomfort, you can take Tylenol (acetaminophen) or ibuprofen as directed on the bottle. (Please do not take these if you have an allergy to them or cannot take them for another reason).  Some redness, tenderness and white or yellow material in the wound is normal healing.  If the area becomes very sore and red, or develops a thick yellow-green material (pus), it may be infected; please notify us.    If you have stitches, return to clinic as directed to have the stitches removed. You will continue wound care for 2-3 days after the stitches  are removed.   Wound healing continues for up to one year following surgery. It is not unusual to experience pain in the scar from time to time during the interval.  If the pain becomes severe or the scar thickens, you should notify the office.    A slight amount of redness in a scar is expected for the first six months.  After six months, the redness will fade and the scar will soften and fade.  The color difference becomes less noticeable with time.  If there are any problems, return for a post-op surgery check at your earliest convenience.  To improve the appearance of the scar, you can use silicone scar gel, cream, or sheets (such as Mederma or Serica) every night for up to one year. These are available over the counter (without a prescription).  Please call our office at [phone] for any questions or concerns.        Spironolactone can cause increased urination and cause blood pressure to decrease. Please watch for signs of lightheadedness and be cautious when changing position. It can sometimes cause breast tenderness or an irregular period in premenopausal women. It can also increase potassium. The increase in potassium usually is not a concern unless you are taking other medicines that also increase potassium, so please be sure your doctor knows all of the other medications you are taking. This medication should  not be taken by pregnant women.  This medicine should also not be taken together with sulfa drugs like Bactrim (trimethoprim/sulfamethexazole).   Seborrheic Keratosis  What causes seborrheic keratoses? Seborrheic keratoses are harmless, common skin growths that first appear during adult life.  As time goes by, more growths appear.  Some people may develop a large number of them.  Seborrheic keratoses appear on both covered and uncovered body parts.  They are not caused by sunlight.  The tendency to develop seborrheic keratoses can be inherited.  They vary in color from  skin-colored to gray, brown, or even black.  They can be either smooth or have a rough, warty surface.   Seborrheic keratoses are superficial and look as if they were stuck on the skin.  Under the microscope this type of keratosis looks like layers upon layers of skin.  That is why at times the top layer may seem to fall off, but the rest of the growth remains and re-grows.    Treatment Seborrheic keratoses do not need to be treated, but can easily be removed in the office.  Seborrheic keratoses often cause symptoms when they rub on clothing or jewelry.  Lesions can be in the way of shaving.  If they become inflamed, they can cause itching, soreness, or burning.  Removal of a seborrheic keratosis can be accomplished by freezing, burning, or surgery. If any spot bleeds, scabs, or grows rapidly, please return to have it checked, as these can be an indication of a skin cancer.  If You Need Anything After Your Visit  If you have any questions or concerns for your doctor, please call our main line at [phone] and press option 4 to reach your doctor's medical assistant. If no one answers, please leave a voicemail as directed and we will return your call as soon as possible. Messages left after 4 pm will be answered the following business day.   You may also send [place] a message via [place]. We typically respond to MyChart messages within 1-2 business days.  For prescription refills, please ask your pharmacy to contact our office. Our fax number is [phone].  If you have an urgent issue when the clinic is closed that cannot wait until the next business day, you can page your doctor at the number below.    Please note that while we do our best to be available for urgent issues outside of office hours, we are not available 24/7.   If you have an urgent issue and are unable to reach [place], you may choose to seek medical care at your doctor's office, retail clinic, urgent care center, or emergency  room.  If you have a medical emergency, please immediately call 911 or go to the emergency department.  Pager Numbers  - Dr. Nehemiah Massed: [phone]  - Dr. Laurence Ferrari: [phone]  - Dr. Nicole Kindred: [phone]  In the event of inclement weather, please call our main line at [phone] for an update on the status of any delays or closures.  Dermatology Medication Tips: Please keep the boxes that topical medications come in in order to help keep track of the instructions about where and how to use these. Pharmacies typically print the medication instructions only on the boxes and not directly on the medication tubes.   If your medication is too expensive, please contact our office at [phone] option 4 or send [place] a message through [place].   We are unable to tell what your co-pay for medications will be in  advance as this is different depending on your insurance coverage. However, we may be able to find a substitute medication at lower cost or fill out paperwork to get insurance to cover a needed medication.   If a prior authorization is required to get your medication covered by your insurance company, please allow [place] 1-2 business days to complete this process.  Drug prices often vary depending on where the prescription is filled and some pharmacies may offer cheaper prices.  The website www.goodrx.com contains coupons for medications through different pharmacies. The prices here do not account for what the cost may be with help from insurance (it may be cheaper with your insurance), but the website can give you the price if you did not use any insurance.  - You can print the associated coupon and take it with your prescription to the pharmacy.  - You may also stop by our office during regular business hours and pick up a GoodRx coupon card.  - If you need your prescription sent electronically to a different pharmacy, notify our office through [facility] or by phone at [phone]  option 4.     Si Usted Necesita Algo Despus de Su Visita  Tambin puede enviarnos un mensaje a travs de Pharmacist, community. Por lo general respondemos a los mensajes de MyChart en el transcurso de 1 a 2 das hbiles.  Para renovar recetas, por favor pida a su farmacia que se ponga en contacto con nuestra oficina. Harland Dingwall de fax es [place] [phone].  Si tiene un asunto urgente cuando la clnica est cerrada y que no puede esperar hasta el siguiente da hbil, puede llamar/localizar a su doctor(a) al nmero que aparece a continuacin.   Por favor, tenga en cuenta que aunque hacemos todo lo posible para estar disponibles para asuntos urgentes fuera del horario de [place], no estamos disponibles las 24 horas del da, los 7 das de la [place].   Si tiene un problema urgente y no puede comunicarse con nosotros, puede optar por buscar atencin mdica  en el consultorio de su doctor(a), en una clnica privada, en un centro de atencin urgente o en una sala de emergencias.  Si tiene Engineering geologist, por favor llame inmediatamente al 911 o vaya a la sala de emergencias.  Nmeros de bper  - Dr. Nehemiah Massed: [phone]  - Dra. Moye: [phone]  - Dra. Nicole Kindred: [phone]  En caso de inclemencias del [place], por favor llame a Johnsie Kindred principal al [phone] para una actualizacin sobre el [place] de cualquier retraso o cierre.  Consejos para la medicacin en dermatologa: Por favor, guarde las cajas en las que vienen los medicamentos de uso tpico para ayudarle a seguir las instrucciones sobre dnde y cmo usarlos. Las farmacias generalmente imprimen las instrucciones del medicamento slo en las cajas y no directamente en los tubos del [place].   Si su medicamento es muy caro, por favor, pngase en contacto con Zigmund Daniel llamando al [phone] y presione la opcin 4 o envenos un mensaje a travs de Pharmacist, community.   No podemos decirle cul ser su copago por los medicamentos  por adelantado ya que esto es diferente dependiendo de la cobertura de su seguro. Sin embargo, es posible que podamos encontrar un medicamento sustituto a Electrical engineer un formulario para que el seguro cubra el medicamento que se considera necesario.   Si se requiere una autorizacin previa para que su compaa de seguros [place] su medicamento, por favor permtanos de 1 a 2  das hbiles para completar [place].  Los precios de los medicamentos varan con frecuencia dependiendo del Environmental consultant de dnde se surte la receta y alguna farmacias pueden ofrecer precios ms baratos.  El sitio web www.goodrx.com tiene cupones para medicamentos de Airline pilot. Los precios aqu no tienen en cuenta lo que podra costar con la ayuda del seguro (puede ser ms barato con su seguro), pero el sitio web puede darle el precio si no utiliz Research scientist (physical sciences).  - Puede imprimir el cupn correspondiente y llevarlo con su receta a la farmacia.  - Tambin puede pasar por nuestra oficina durante el horario de atencin regular y Charity fundraiser una tarjeta de cupones de GoodRx.  - Si necesita que su receta se enve electrnicamente a una farmacia diferente, informe a nuestra oficina a travs de MyChart de [place] o por telfono llamando al [phone] y presione la opcin 4.

## 2022-01-02 ENCOUNTER — Other Ambulatory Visit: Payer: Self-pay

## 2022-01-02 DIAGNOSIS — L700 Acne vulgaris: Secondary | ICD-10-CM

## 2022-01-02 MED ORDER — SPIRONOLACTONE 100 MG PO TABS: 100.0000 mg | ORAL_TABLET | Freq: Every day | ORAL | 3 refills | Status: DC

## 2022-01-06 ENCOUNTER — Telehealth: Payer: Self-pay

## 2022-01-06 NOTE — Telephone Encounter (Signed)
-----   Message from Brendolyn Patty, MD sent at 01/06/2022 12:03 PM EST ----- Skin , left medial lower leg DERMATOFIBROMA, BASE INVOLVED  Benign DF, may recur - please call patient

## 2022-01-06 NOTE — Telephone Encounter (Signed)
Left pt msg to call for bx results/sh 

## 2022-01-07 ENCOUNTER — Telehealth: Payer: Self-pay

## 2022-01-07 NOTE — Telephone Encounter (Signed)
Advised pt of bx results/sh ?

## 2022-01-07 NOTE — Telephone Encounter (Signed)
-----   Message from Brendolyn Patty, MD sent at 01/06/2022 12:03 PM EST ----- Skin , left medial lower leg DERMATOFIBROMA, BASE INVOLVED  Benign DF, may recur - please call patient

## 2023-01-06 ENCOUNTER — Ambulatory Visit: Payer: No Typology Code available for payment source | Admitting: Dermatology

## 2023-01-06 VITALS — BP 129/79 | HR 67

## 2023-01-06 DIAGNOSIS — L700 Acne vulgaris: Secondary | ICD-10-CM | POA: Diagnosis not present

## 2023-01-06 DIAGNOSIS — D1801 Hemangioma of skin and subcutaneous tissue: Secondary | ICD-10-CM

## 2023-01-06 DIAGNOSIS — D229 Melanocytic nevi, unspecified: Secondary | ICD-10-CM

## 2023-01-06 MED ORDER — SPIRONOLACTONE 100 MG PO TABS: 100.0000 mg | ORAL_TABLET | Freq: Every day | ORAL | 3 refills | Status: DC

## 2023-01-06 MED ORDER — CLINDAMYCIN PHOS-BENZOYL PEROX 1-5 % EX GEL
CUTANEOUS | 6 refills | Status: DC
Start: 2023-01-06 — End: 2024-01-12

## 2023-01-06 NOTE — Patient Instructions (Addendum)
Spironolactone can cause increased urination and cause blood pressure to decrease. Please watch for signs of lightheadedness and be cautious when changing position. It can sometimes cause breast tenderness or an irregular period in premenopausal women. It can also increase potassium. The increase in potassium usually is not a concern unless you are taking other medicines that also increase potassium, so please be sure your doctor knows all of the other medications you are taking. This medication should not be taken by pregnant women.  This medicine should also not be taken together with sulfa drugs like Bactrim (trimethoprim/sulfamethexazole).      Due to recent changes in healthcare laws, you may see results of your pathology and/or laboratory studies on MyChart before the doctors have had a chance to review them. We understand that in some cases there may be results that are confusing or concerning to you. Please understand that not all results are received at the same time and often the doctors may need to interpret multiple results in order to provide you with the best plan of care or course of treatment. Therefore, we ask that you please give [place] 2 business days to thoroughly review all your results before contacting the office for clarification. Should we see a critical lab result, you will be contacted sooner.   If You Need Anything After Your Visit  If you have any questions or concerns for your doctor, please call our main line at [phone] and press option 4 to reach your doctor's medical assistant. If no one answers, please leave a voicemail as directed and we will return your call as soon as possible. Messages left after 4 pm will be answered the following business day.   You may also send [place] a message via [place]. We typically respond to MyChart messages within 1-2 business days.  For prescription refills, please ask your pharmacy to contact our office. Our fax number is  [phone].  If you have an urgent issue when the clinic is closed that cannot wait until the next business day, you can page your doctor at the number below.    Please note that while we do our best to be available for urgent issues outside of office hours, we are not available 24/7.   If you have an urgent issue and are unable to reach [place], you may choose to seek medical care at your doctor's office, retail clinic, urgent care center, or emergency room.  If you have a medical emergency, please immediately call 911 or go to the emergency department.  Pager Numbers  - Dr. Nehemiah Massed: [phone]  - Dr. Laurence Ferrari: [phone]  - Dr. Nicole Kindred: [phone]  In the event of inclement weather, please call our main line at [phone] for an update on the status of any delays or closures.  Dermatology Medication Tips: Please keep the boxes that topical medications come in in order to help keep track of the instructions about where and how to use these. Pharmacies typically print the medication instructions only on the boxes and not directly on the medication tubes.   If your medication is too expensive, please contact our office at [phone] option 4 or send [place] a message through [place].   We are unable to tell what your co-pay for medications will be in advance as this is different depending on your insurance coverage. However, we may be able to find a substitute medication at lower cost or fill out paperwork to get insurance to cover a needed medication.   If  a prior authorization is required to get your medication covered by your insurance company, please allow [place] 1-2 business days to complete this process.  Drug prices often vary depending on where the prescription is filled and some pharmacies may offer cheaper prices.  The website www.goodrx.com contains coupons for medications through different pharmacies. The prices here do not account for what the cost may be with help from  insurance (it may be cheaper with your insurance), but the website can give you the price if you did not use any insurance.  - You can print the associated coupon and take it with your prescription to the pharmacy.  - You may also stop by our office during regular business hours and pick up a GoodRx coupon card.  - If you need your prescription sent electronically to a different pharmacy, notify our office through [facility] or by phone at [phone] option 4.     Si Usted Necesita Algo Despus de Su Visita  Tambin puede enviarnos un mensaje a travs de Pharmacist, community. Por lo general respondemos a los mensajes de MyChart en el transcurso de 1 a 2 das hbiles.  Para renovar recetas, por favor pida a su farmacia que se ponga en contacto con nuestra oficina. Harland Dingwall de fax es [place] [phone].  Si tiene un asunto urgente cuando la clnica est cerrada y que no puede esperar hasta el siguiente da hbil, puede llamar/localizar a su doctor(a) al nmero que aparece a continuacin.   Por favor, tenga en cuenta que aunque hacemos todo lo posible para estar disponibles para asuntos urgentes fuera del horario de [place], no estamos disponibles las 24 horas del da, los 7 das de la [place].   Si tiene un problema urgente y no puede comunicarse con nosotros, puede optar por buscar atencin mdica  en el consultorio de su doctor(a), en una clnica privada, en un centro de atencin urgente o en una sala de emergencias.  Si tiene Engineering geologist, por favor llame inmediatamente al 911 o vaya a la sala de emergencias.  Nmeros de bper  - Dr. Nehemiah Massed: [phone]  - Dra. Moye: [phone]  - Dra. Nicole Kindred: [phone]  En caso de inclemencias del [place], por favor llame a Johnsie Kindred principal al [phone] para una actualizacin sobre el [place] de cualquier retraso o cierre.  Consejos para la medicacin en dermatologa: Por favor, guarde las cajas en las que vienen los  medicamentos de uso tpico para ayudarle a seguir las instrucciones sobre dnde y cmo usarlos. Las farmacias generalmente imprimen las instrucciones del medicamento slo en las cajas y no directamente en los tubos del [place].   Si su medicamento es muy caro, por favor, pngase en contacto con Zigmund Daniel llamando al [phone] y presione la opcin 4 o envenos un mensaje a travs de Pharmacist, community.   No podemos decirle cul ser su copago por los medicamentos por adelantado ya que esto es diferente dependiendo de la cobertura de su seguro. Sin embargo, es posible que podamos encontrar un medicamento sustituto a Electrical engineer un formulario para que el seguro cubra el medicamento que se considera necesario.   Si se requiere una autorizacin previa para que su compaa de seguros [place] su medicamento, por favor permtanos de 1 a 2 das hbiles para completar este proceso.  Los precios de los medicamentos varan con frecuencia dependiendo del Environmental consultant de dnde se surte la receta y alguna farmacias pueden ofrecer precios ms baratos.  El sitio web www.goodrx.com tiene  de diferentes farmacias. Los precios aqu no tienen en cuenta lo que podra costar con la ayuda del seguro (puede ser ms barato con su seguro), pero el sitio web puede darle el precio si no utiliz ningn seguro.  - Puede imprimir el cupn correspondiente y llevarlo con su receta a la farmacia.  - Tambin puede pasar por nuestra oficina durante el horario de atencin regular y recoger una tarjeta de cupones de GoodRx.  - Si necesita que su receta se enve electrnicamente a una farmacia diferente, informe a nuestra oficina a travs de MyChart de [place] o por telfono llamando al [phone] y presione la opcin 4.  

## 2023-01-06 NOTE — Progress Notes (Signed)
Follow-Up Visit   Subjective  Meagan Leach is a 52 y.o. female who presents for the following: Acne (Patient here for acne follow up, currently on spironolactone and clindamycin. ).  She has spot on upper lip that gets irritated and has bled.  The following portions of the chart were reviewed this encounter and updated as appropriate:      Review of Systems: No other skin or systemic complaints except as noted in HPI or Assessment and Plan.   Objective  Well appearing patient in no apparent distress; mood and affect are within normal limits.  A focused examination was performed including face. Relevant physical exam findings are noted in the Assessment and Plan.  face Face clear today   left upper lip Red papule   Assessment & Plan  Acne vulgaris face  Chronic condition with duration or expected duration over one year. Currently well-controlled.    BP today 129/79   Continue spironolactone 100 mg tablet by mouth daily 6 rfs patient request 90 days 3 refill    Continue clindamycin - benzoyl peroxide (Benzaclin) gel qd prn flares as directed    Spironolactone can cause increased urination and cause blood pressure to decrease. Please watch for signs of lightheadedness and be cautious when changing position. It can sometimes cause breast tenderness or an irregular period in premenopausal women. It can also increase potassium. The increase in potassium usually is not a concern unless you are taking other medicines that also increase potassium, so please be sure your doctor knows all of the other medications you are taking. This medication should not be taken by pregnant women.  This medicine should also not be taken together with sulfa drugs like Bactrim (trimethoprim/sulfamethexazole).   Benzoyl peroxide can cause dryness and irritation of the skin. It can also bleach fabric. When used together with Aczone (dapsone) cream, it can stain the skin orange.      clindamycin-benzoyl peroxide (BENZACLIN) gel - face Apply topically as directed. Qd to bid to face for acne  spironolactone (ALDACTONE) 100 MG tablet - face Take 1 tablet (100 mg total) by mouth daily.  Hemangioma of skin left upper lip  Patient is bothered by spot at left upper lip (has bled) and would like it removed.    Discussed BBL $200 per txt session vs ED $60 for first lesion and $15 for each additional lesion treated on same day.  Small risk of scar with ED.  May take more than one tx to clear.  Patient prefers treatment with electrodesiccation.  One spot treated today, $60 charge  Discussed cosmetic procedure (ED), noncovered.  $60 for 1st lesion and $15 for each additional lesion if done on the same day.  Maximum charge $350.  One touch-up treatment included no charge. Discussed risks of treatment including dyspigmentation, small scar, and/or recurrence. Recommend daily broad spectrum sunscreen SPF 30+/photoprotection to treated areas once healed.   Destruction of lesion - left upper lip  Destruction method: electrodesiccation and curettage   Destruction method comment:  Electrodesiccation only, not curretage Informed consent: discussed and consent obtained   Timeout:  patient name, date of birth, surgical site, and procedure verified Hemostasis achieved with:  electrodesiccation Outcome: patient tolerated procedure well with no complications   Post-procedure details: wound care instructions given    Melanocytic Nevi - Tan-brown and/or pink-flesh-colored symmetric macules and papules - Benign appearing on exam today - Observation - Call clinic for new or changing moles - Recommend daily use of broad spectrum  spf 30+ sunscreen to sun-exposed areas.    Return in about 1 year (around 01/07/2024) for acne .  I, Ruthell Rummage, CMA, am acting as scribe for Brendolyn Patty, MD.  Documentation: I have reviewed the above documentation for accuracy and completeness, and I  agree with the above.  Brendolyn Patty MD

## 2023-04-28 ENCOUNTER — Ambulatory Visit: Payer: No Typology Code available for payment source | Admitting: Dermatology

## 2023-04-28 DIAGNOSIS — D1801 Hemangioma of skin and subcutaneous tissue: Secondary | ICD-10-CM

## 2023-04-28 NOTE — Progress Notes (Signed)
   Follow-Up Visit   Subjective  Meagan Leach is a 52 y.o. female who presents for the following: follow-up cosmetic hemangioma of the left upper lip. Treated with ED 01/06/2023 with residual. Touch-up treatment today, no charge.    The following portions of the chart were reviewed this encounter and updated as appropriate: medications, allergies, medical history  Review of Systems:  No other skin or systemic complaints except as noted in HPI or Assessment and Plan.  Objective  Well appearing patient in no apparent distress; mood and affect are within normal limits.  A focused examination was performed of the following areas: face Relevant physical exam findings are noted in the Assessment and Plan.  Left Upper Lip Residual red papule of the left upper lip.       Assessment & Plan   Hemangioma of skin Left Upper Lip  Residual lesion after ED treatment 01/06/2023. Touch-up treatment today, no charge.  Destruction of lesion - Left Upper Lip  Destruction method: electrodesiccation and curettage   Destruction method comment:  Electrodesiccation only, no curettage Timeout:  patient name, date of birth, surgical site, and procedure verified Outcome: patient tolerated procedure well with no complications   Post-procedure details: wound care instructions given   Post-procedure details comment:  Ointment and bandage applied.    Return as scheduled.  ICherlyn Labella, CMA, am acting as scribe for Willeen Niece, MD .   Documentation: I have reviewed the above documentation for accuracy and completeness, and I agree with the above.  Willeen Niece, MD

## 2023-04-28 NOTE — Patient Instructions (Signed)
Due to recent changes in healthcare laws, you may see results of your pathology and/or laboratory studies on MyChart before the doctors have had a chance to review them. We understand that in some cases there may be results that are confusing or concerning to you. Please understand that not all results are received at the same time and often the doctors may need to interpret multiple results in order to provide you with the best plan of care or course of treatment. Therefore, we ask that you please give us 2 business days to thoroughly review all your results before contacting the office for clarification. Should we see a critical lab result, you will be contacted sooner.   If You Need Anything After Your Visit  If you have any questions or concerns for your doctor, please call our main line at [phone] and press option 4 to reach your doctor's medical assistant. If no one answers, please leave a voicemail as directed and we will return your call as soon as possible. Messages left after 4 pm will be answered the following business day.   You may also send us a message via MyChart. We typically respond to MyChart messages within 1-2 business days.  For prescription refills, please ask your pharmacy to contact our office. Our fax number is [phone].  If you have an urgent issue when the clinic is closed that cannot wait until the next business day, you can page your doctor at the number below.    Please note that while we do our best to be available for urgent issues outside of office hours, we are not available 24/7.   If you have an urgent issue and are unable to reach us, you may choose to seek medical care at your doctor's office, retail clinic, urgent care center, or emergency room.  If you have a medical emergency, please immediately call 911 or go to the emergency department.  Pager Numbers  - Dr. Kowalski: [phone]  - Dr. Moye: [phone]  - Dr. Stewart:  [phone]  In the event of inclement weather, please call our main line at [phone] for an update on the status of any delays or closures.  Dermatology Medication Tips: Please keep the boxes that topical medications come in in order to help keep track of the instructions about where and how to use these. Pharmacies typically print the medication instructions only on the boxes and not directly on the medication tubes.   If your medication is too expensive, please contact our office at [phone] option 4 or send us a message through MyChart.   We are unable to tell what your co-pay for medications will be in advance as this is different depending on your insurance coverage. However, we may be able to find a substitute medication at lower cost or fill out paperwork to get insurance to cover a needed medication.   If a prior authorization is required to get your medication covered by your insurance company, please allow us 1-2 business days to complete this process.  Drug prices often vary depending on where the prescription is filled and some pharmacies may offer cheaper prices.  The website www.goodrx.com contains coupons for medications through different pharmacies. The prices here do not account for what the cost may be with help from insurance (it may be cheaper with your insurance), but the website can give you the price if you did not use any insurance.  - You can print the associated coupon and take it with   your prescription to the pharmacy.  - You may also stop by our office during regular business hours and pick up a GoodRx coupon card.  - If you need your prescription sent electronically to a different pharmacy, notify our office through [place] MyChart or by phone at [phone] option 4.     Si Usted Necesita Algo Despus de Su Visita  Tambin puede enviarnos un mensaje a travs de MyChart. Por lo general respondemos a los mensajes de MyChart en el transcurso de 1 a 2  das hbiles.  Para renovar recetas, por favor pida a su farmacia que se ponga en contacto con nuestra oficina. Nuestro nmero de fax es el [phone].  Si tiene un asunto urgente cuando la clnica est cerrada y que no puede esperar hasta el siguiente da hbil, puede llamar/localizar a su doctor(a) al nmero que aparece a continuacin.   Por favor, tenga en cuenta que aunque hacemos todo lo posible para estar disponibles para asuntos urgentes fuera del horario de oficina, no estamos disponibles las 24 horas del da, los 7 das de la semana.   Si tiene un problema urgente y no puede comunicarse con nosotros, puede optar por buscar atencin mdica  en el consultorio de su doctor(a), en una clnica privada, en un centro de atencin urgente o en una sala de emergencias.  Si tiene una emergencia mdica, por favor llame inmediatamente al 911 o vaya a la sala de emergencias.  Nmeros de bper  - Dr. Kowalski: [phone]  - Dra. Moye: [phone]  - Dra. Stewart: [phone]  En caso de inclemencias del tiempo, por favor llame a nuestra lnea principal al [phone] para una actualizacin sobre el [place] de cualquier retraso o cierre.  Consejos para la medicacin en dermatologa: Por favor, guarde las cajas en las que vienen los medicamentos de uso tpico para ayudarle a seguir las instrucciones sobre dnde y cmo usarlos. Las farmacias generalmente imprimen las instrucciones del medicamento slo en las cajas y no directamente en los tubos del medicamento.   Si su medicamento es muy caro, por favor, pngase en contacto con nuestra oficina llamando al [phone] y presione la opcin 4 o envenos un mensaje a travs de MyChart.   No podemos decirle cul ser su copago por los medicamentos por adelantado ya que esto es diferente dependiendo de la cobertura de su seguro. Sin embargo, es posible que podamos encontrar un medicamento sustituto a menor costo o llenar un formulario para que el  seguro cubra el medicamento que se considera necesario.   Si se requiere una autorizacin previa para que su compaa de seguros cubra su medicamento, por favor permtanos de 1 a 2 das hbiles para completar este proceso.  Los precios de los medicamentos varan con frecuencia dependiendo del lugar de dnde se surte la receta y alguna farmacias pueden ofrecer precios ms baratos.  El sitio web www.goodrx.com tiene cupones para medicamentos de diferentes farmacias. Los precios aqu no tienen en cuenta lo que podra costar con la ayuda del seguro (puede ser ms barato con su seguro), pero el sitio web puede darle el precio si no utiliz ningn seguro.  - Puede imprimir el cupn correspondiente y llevarlo con su receta a la farmacia.  - Tambin puede pasar por nuestra oficina durante el horario de atencin regular y recoger una tarjeta de cupones de GoodRx.  - Si necesita que su receta se enve electrnicamente a una farmacia diferente, informe a nuestra oficina a travs de MyChart de [place]   o por telfono llamando al [phone] y presione la opcin 4.  

## 2023-05-14 ENCOUNTER — Ambulatory Visit
Admission: EM | Admit: 2023-05-14 | Discharge: 2023-05-14 | Disposition: A | Discharge status: Home / Self Care | Payer: No Typology Code available for payment source | Attending: Emergency Medicine | Admitting: Emergency Medicine

## 2023-05-14 DIAGNOSIS — S0502XA Injury of conjunctiva and corneal abrasion without foreign body, left eye, initial encounter: Secondary | ICD-10-CM | POA: Diagnosis not present

## 2023-05-14 MED ORDER — TOBRAMYCIN-DEXAMETHASONE 0.3-0.1 % OP SUSP: 2.0000 [drp] | Freq: Four times a day (QID) | OPHTHALMIC | 0 refills | Status: DC

## 2023-05-14 NOTE — ED Provider Notes (Signed)
[facility]    CSN: [id number] Arrival date & time: 05/14/23  1218      History   Chief Complaint Chief Complaint  Patient presents with   Eye Problem    HPI Meagan Leach is a 52 y.o. female.   HPI  52 year old female with no significant past medical history presents for evaluation of foreign body sensation in her left eye.  She reports that she first felt a sensation when she put a new pair of contacts and 2 days ago.  She felt like something was caught between her contact lens in her eyeball.  She took her contacts out but could not find a foreign body.  She did irrigate her eye without any improvement of her symptoms.  She states she feels like there is something in the lower left-hand corner of her eye and she is experiencing blurry vision.  This morning she woke up and her left eye was crusted shut and is now red and inflamed.  Past Medical History:  Diagnosis Date   Endometriosis     There are no problems to display for this patient.   History reviewed. No pertinent surgical history.  OB History   No obstetric history on file.      Home Medications    Prior to Admission medications   Medication Sig Start Date End Date Taking? Authorizing Provider  tobramycin-dexamethasone [facility]) ophthalmic solution Place 2 drops into the right eye every 6 (six) hours. 05/14/23  Yes Becky Augusta, NP  clindamycin-benzoyl peroxide (BENZACLIN) gel Apply topically as directed. Qd to bid to face for acne 01/06/23   Willeen Niece, MD  metoCLOPramide (REGLAN) 10 MG tablet Take 1 tablet (10 mg total) by mouth 4 (four) times daily -  before meals and at bedtime. 01/17/19 01/17/20  Tommi Rumps, PA-C  spironolactone (ALDACTONE) 100 MG tablet Take 1 tablet (100 mg total) by mouth daily. 01/06/23   Willeen Niece, MD  spironolactone (ALDACTONE) 50 MG tablet Take 50 mg by mouth 2 (two) times daily. Patient not taking: Reported on 01/06/2023    [provider]     Family History History reviewed. No pertinent family history.  Social History Social History   Tobacco Use   Smoking status: Never   Smokeless tobacco: Never  Vaping Use   Vaping Use: Never used  Substance Use Topics   Alcohol use: Yes    Comment: seldom   Drug use: Never     Allergies   Penicillins   Review of Systems Review of Systems  Eyes:  Positive for photophobia, pain, discharge, redness and visual disturbance.     Physical Exam Triage Vital Signs ED Triage Vitals [05/14/23 1235]  Enc Vitals Group     BP      Pulse      Resp      Temp      Temp src      SpO2      Weight 139 lb (63 kg)     Height 5\' 1"  (1.549 m)     Head Circumference      Peak Flow      Pain Score 3     Pain Loc      Pain Edu?      Excl. in GC?    No data found.  Updated Vital Signs BP (!) 144/88 (BP Location: Left Arm)   Pulse 95   Temp 98.9 F (37.2 C) (Oral)   Ht 5\' 1"  (1.549 m)  Wt 139 lb (63 kg)   LMP 05/07/2023   SpO2 97%   BMI 26.26 kg/m   Visual Acuity Right Eye Distance: 20/25 Left Eye Distance: 20/25 Bilateral Distance: 20/25 (20/25)  Right Eye Near:   Left Eye Near:    Bilateral Near:   (Pt wears Glasses)  Physical Exam Vitals and nursing note reviewed.  Constitutional:      Appearance: Normal appearance. She is not ill-appearing.  HENT:     Head: Normocephalic and atraumatic.  Eyes:     General: No scleral icterus.       Right eye: No discharge.        Left eye: No discharge.     Extraocular Movements: Extraocular movements intact.     Pupils: Pupils are equal, round, and reactive to light.  Neurological:     Mental Status: She is alert.      UC Treatments / Results  Labs (all labs ordered are listed, but only abnormal results are displayed) Labs Reviewed - No data to display  EKG   Radiology No results found.  Procedures Procedures (including critical care time)  Medications Ordered in UC Medications - No data to  display  Initial Impression / Assessment and Plan / UC Course  I have reviewed the triage vital signs and the nursing notes.  Pertinent labs & imaging results that were available during my care of the patient were reviewed by me and considered in my medical decision making (see chart for details).   Patient is a very pleasant, nontoxic-appearing 52 year old female here for evaluation of foreign body sensation in the left eye as outlined HPI above.  Patient sclera is injected, particularly in the outer left quadrant.  Labral conjunctiva are unremarkable.  Pupils equal round and reactive of, EOM's intact, and left eye has normal red light reflex.  No foreign body visible on visual inspection.  I did instill 2 drops of tetracaine in the patient's left eye to anesthetize it before instilling fluorescein dye for Woods lamp examination.  The [employer] exam reveals a large corneal abrasion in the lower portion of the eye with the predominant portion of the abrasion being on the lateral aspect where the patient is having pain.  I will treat the patient for corneal abrasion with TobraDex eyedrops, 2 drops in her left eye 4 times a day for 5 days.  She does have an eye doctor and I have advised her that if her symptoms are not improving, or they worsen, that she needs to follow-up with them.  She should discard her current contacts and open a new pair of contacts once she has completed antibiotic therapy.   Final Clinical Impressions(s) / UC Diagnoses   Final diagnoses:  Abrasion of left cornea, initial encounter     Discharge Instructions      Instill 2 drops of TobraDex in your left eye 4 times a day for 5 days.  Abstain from wearing contacts until your symptoms have completely resolved.  When you do resume wearing contacts start with a fresh pair.  Make sure that you are storing your contracts in a clean with clean saline nightly.  Wash your contacts with an enzymatic cleaner once weekly and  rinse them thoroughly with new, clean contact lens solution to remove any enzymatic residue.  If you have any worsening of your symptoms such as increased pain, increased redness, increased photosensitivity, or changes in your vision you need to follow-up with your eye doctor.  ED Prescriptions     Medication Sig Dispense Auth. Provider   tobramycin-dexamethasone [facility]) ophthalmic solution Place 2 drops into the right eye every 6 (six) hours. 5 mL Becky Augusta, NP      PDMP not reviewed this encounter.   Becky Augusta, NP 05/14/23 1258

## 2023-05-14 NOTE — ED Triage Notes (Signed)
Pt is with her husband.  Pt states that she put her contact in on Tuesday and believes that something was on her contact.   Pt states that she feels it in her eye and is unable to get it out.   Pt c/o left eye swelling and redness.   Pt states that she can feel it move across her eye and it travels from left to right.  Pt wears glasses and states that her left eye is blurry and was crusted over this morning upon waking.

## 2023-05-14 NOTE — Discharge Instructions (Signed)
Instill 2 drops of TobraDex in your left eye 4 times a day for 5 days.  Abstain from wearing contacts until your symptoms have completely resolved.  When you do resume wearing contacts start with a fresh pair.  Make sure that you are storing your contracts in a clean with clean saline nightly.  Wash your contacts with an enzymatic cleaner once weekly and rinse them thoroughly with new, clean contact lens solution to remove any enzymatic residue.  If you have any worsening of your symptoms such as increased pain, increased redness, increased photosensitivity, or changes in your vision you need to follow-up with your eye doctor.  

## 2023-10-01 ENCOUNTER — Ambulatory Visit
Admission: EM | Admit: 2023-10-01 | Discharge: 2023-10-01 | Disposition: A | Discharge status: Home / Self Care | Payer: No Typology Code available for payment source | Attending: Internal Medicine | Admitting: Internal Medicine

## 2023-10-01 ENCOUNTER — Encounter: Payer: Self-pay | Admitting: Emergency Medicine

## 2023-10-01 ENCOUNTER — Ambulatory Visit: Payer: No Typology Code available for payment source

## 2023-10-01 DIAGNOSIS — J029 Acute pharyngitis, unspecified: Secondary | ICD-10-CM | POA: Diagnosis present

## 2023-10-01 DIAGNOSIS — R051 Acute cough: Secondary | ICD-10-CM

## 2023-10-01 DIAGNOSIS — H6993 Unspecified Eustachian tube disorder, bilateral: Secondary | ICD-10-CM

## 2023-10-01 DIAGNOSIS — J069 Acute upper respiratory infection, unspecified: Secondary | ICD-10-CM

## 2023-10-01 LAB — GROUP A STREP BY PCR: Group A Strep by PCR: NOT DETECTED

## 2023-10-01 LAB — RESP PANEL BY RT-PCR (FLU A&B, COVID) ARPGX2
Influenza A by PCR: NEGATIVE
Influenza B by PCR: NEGATIVE
SARS Coronavirus 2 by RT PCR: NEGATIVE

## 2023-10-01 MED ORDER — ALBUTEROL SULFATE HFA 108 (90 BASE) MCG/ACT IN AERS
1.0000 | INHALATION_SPRAY | Freq: Four times a day (QID) | RESPIRATORY_TRACT | 0 refills | Status: AC | PRN
Start: 2023-10-01 — End: ?

## 2023-10-01 MED ORDER — PREDNISONE 20 MG PO TABS
40.0000 mg | ORAL_TABLET | Freq: Every day | ORAL | 0 refills | Status: AC
Start: 2023-10-01 — End: 2023-10-06

## 2023-10-01 MED ORDER — BENZONATATE 200 MG PO CAPS: 200.0000 mg | ORAL_CAPSULE | Freq: Three times a day (TID) | ORAL | 0 refills | Status: DC | PRN

## 2023-10-01 NOTE — ED Triage Notes (Signed)
Pt presents with a cough, fever, congestion, wheezing and sore throat x 4 days.

## 2023-10-01 NOTE — Discharge Instructions (Addendum)
You may take Tessalon as needed for cough.  Albuterol inhaler as needed.  Prednisone daily for 5 days to help with your ear pain.  Lots of rest and fluids.  Please follow-up with your PCP if your symptoms do not improve.  Please go to the ER for any worsening symptoms.  I hope you feel better soon!

## 2023-10-01 NOTE — ED Provider Notes (Signed)
[facility]    CSN: [id number] Arrival date & time: 10/01/23  1617      History   Chief Complaint Chief Complaint  Patient presents with   Wheezing   Headache   Cough   Otalgia    HPI Meagan Leach is a 52 y.o. female  presents for evaluation of URI symptoms for 4 days. Patient reports associated symptoms of cough, congestion, sore throat, low-grade fever, chest tightness and wheezing/shortness of breath. Denies N/V/D, ear pain. Patient does not have a hx of asthma. Patient does not have a history of smoking.  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  Pt has taken cold medicine OTC for symptoms. Pt has no other concerns at this time.    Wheezing Associated symptoms: chest tightness, cough, fever and sore throat   Headache Associated symptoms: congestion, cough, fever and sore throat   Cough Associated symptoms: fever, sore throat and wheezing   Otalgia Associated symptoms: congestion, cough, fever and sore throat     Past Medical History:  Diagnosis Date   Endometriosis     There are no problems to display for this patient.   History reviewed. No pertinent surgical history.  OB History   No obstetric history on file.      Home Medications    Prior to Admission medications   Medication Sig Start Date End Date Taking? Authorizing Provider  albuterol (VENTOLIN HFA) 108 (90 Base) MCG/ACT inhaler Inhale 1-2 puffs into the lungs every 6 (six) hours as needed for wheezing or shortness of breath. 10/01/23  Yes Radford Pax, NP  benzonatate (TESSALON) 200 MG capsule Take 1 capsule (200 mg total) by mouth 3 (three) times daily as needed. 10/01/23  Yes Radford Pax, NP  predniSONE (DELTASONE) 20 MG tablet Take 2 tablets (40 mg total) by mouth daily with breakfast for 5 days. 10/01/23 10/06/23 Yes Radford Pax, NP  spironolactone (ALDACTONE) 100 MG tablet Take 1 tablet (100 mg total) by mouth daily. 01/06/23  Yes Willeen Niece, MD   clindamycin-benzoyl peroxide [facility]) gel Apply topically as directed. Qd to bid to face for acne 01/06/23   Willeen Niece, MD  metoCLOPramide (REGLAN) 10 MG tablet Take 1 tablet (10 mg total) by mouth 4 (four) times daily -  before meals and at bedtime. 01/17/19 01/17/20  Tommi Rumps, PA-C  spironolactone (ALDACTONE) 50 MG tablet Take 50 mg by mouth 2 (two) times daily. Patient not taking: Reported on 01/06/2023    [provider]  tobramycin-dexamethasone [facility]) ophthalmic solution Place 2 drops into the right eye every 6 (six) hours. 05/14/23   Becky Augusta, NP    Family History No family history on file.  Social History Social History   Tobacco Use   Smoking status: Never   Smokeless tobacco: Never  Vaping Use   Vaping status: Never Used  Substance Use Topics   Alcohol use: Yes    Comment: seldom   Drug use: Never     Allergies   Penicillins   Review of Systems Review of Systems  Constitutional:  Positive for fever.  HENT:  Positive for congestion and sore throat.   Respiratory:  Positive for cough, chest tightness and wheezing.      Physical Exam Triage Vital Signs ED Triage Vitals  Encounter Vitals Group     BP 10/01/23 1654 (!) 176/106     Systolic BP Percentile --      Diastolic BP Percentile --  Pulse Rate 10/01/23 1654 (!) 104     Resp 10/01/23 1654 16     Temp 10/01/23 1642 99.1 F (37.3 C)     Temp Source 10/01/23 1642 Oral     SpO2 10/01/23 1654 97 %     Weight --      Height --      Head Circumference --      Peak Flow --      Pain Score 10/01/23 1640 5     Pain Loc --      Pain Education --      Exclude from Growth Chart --    No data found.  Updated Vital Signs BP (!) 145/95 (BP Location: Right Arm)   Pulse (!) 104   Temp 99.1 F (37.3 C) (Oral)   Resp 16   LMP 09/21/2023   SpO2 97%   Visual Acuity Right Eye Distance:   Left Eye Distance:   Bilateral Distance:    Right Eye Near:   Left Eye Near:     Bilateral Near:     Physical Exam Vitals and nursing note reviewed.  Constitutional:      General: She is not in acute distress.    Appearance: She is well-developed. She is not ill-appearing.  HENT:     Head: Normocephalic and atraumatic.     Right Ear: Tympanic membrane and ear canal normal.     Left Ear: Tympanic membrane and ear canal normal.     Nose: Congestion present.     Mouth/Throat:     Mouth: Mucous membranes are moist.     Pharynx: Oropharynx is clear. Uvula midline. Posterior oropharyngeal erythema present.     Tonsils: No tonsillar exudate or tonsillar abscesses.  Eyes:     Conjunctiva/sclera: Conjunctivae normal.     Pupils: Pupils are equal, round, and reactive to light.  Cardiovascular:     Rate and Rhythm: Regular rhythm. Tachycardia present.     Heart sounds: Normal heart sounds.     Comments: Mildly tachy at 104 Pulmonary:     Effort: Pulmonary effort is normal.     Breath sounds: Normal breath sounds.  Musculoskeletal:     Cervical back: Normal range of motion and neck supple.  Lymphadenopathy:     Cervical: No cervical adenopathy.  Skin:    General: Skin is warm and dry.  Neurological:     General: No focal deficit present.     Mental Status: She is alert and oriented to person, place, and time.  Psychiatric:        Mood and Affect: Mood normal.        Behavior: Behavior normal.      UC Treatments / Results  Labs (all labs ordered are listed, but only abnormal results are displayed) Labs Reviewed  GROUP A STREP BY PCR  RESP PANEL BY RT-PCR (FLU A&B, COVID) [id number]    EKG   Radiology No results found.  Procedures Procedures (including critical care time)  Medications Ordered in UC Medications - No data to display  Initial Impression / Assessment and Plan / UC Course  I have reviewed the triage vital signs and the nursing notes.  Pertinent labs & imaging results that were available during my care of the patient were reviewed by me  and considered in my medical decision making (see chart for details).     Reviewed exam and symptoms with patient.  Negative flu, COVID, strep PCR.  Chest x-ray without obvious consolidation, will  contact patient for any positive results based on radiology overread once available.  Discussed viral illness and symptomatic treatment.  Albuterol inhaler, Tessalon as needed.  Prednisone for eustachian tube dysfunction.  PCP follow-up symptoms do not improve.  ER precautions reviewed. Final Clinical Impressions(s) / UC Diagnoses   Final diagnoses:  Sore throat  Acute cough  Viral upper respiratory illness  Eustachian tube dysfunction, bilateral     Discharge Instructions      You may take Tessalon as needed for cough.  Albuterol inhaler as needed.  Prednisone daily for 5 days to help with your ear pain.  Lots of rest and fluids.  Please follow-up with your PCP if your symptoms do not improve.  Please go to the ER for any worsening symptoms.  I hope you feel better soon!     ED Prescriptions     Medication Sig Dispense Auth. Provider   benzonatate (TESSALON) 200 MG capsule Take 1 capsule (200 mg total) by mouth 3 (three) times daily as needed. 20 capsule Radford Pax, NP   albuterol (VENTOLIN HFA) 108 (90 Base) MCG/ACT inhaler Inhale 1-2 puffs into the lungs every 6 (six) hours as needed for wheezing or shortness of breath. 1 each Radford Pax, NP   predniSONE (DELTASONE) 20 MG tablet Take 2 tablets (40 mg total) by mouth daily with breakfast for 5 days. 10 tablet Radford Pax, NP      PDMP not reviewed this encounter.   Radford Pax, NP 10/01/23 [phone]

## 2023-11-15 ENCOUNTER — Ambulatory Visit
Admission: EM | Admit: 2023-11-15 | Discharge: 2023-11-15 | Disposition: A | Discharge status: Home / Self Care | Payer: 59 | Attending: Physician Assistant | Admitting: Physician Assistant

## 2023-11-15 ENCOUNTER — Encounter: Payer: Self-pay | Admitting: Emergency Medicine

## 2023-11-15 DIAGNOSIS — H1033 Unspecified acute conjunctivitis, bilateral: Secondary | ICD-10-CM

## 2023-11-15 MED ORDER — MOXIFLOXACIN HCL 0.5 % OP SOLN: 1.0000 [drp] | Freq: Three times a day (TID) | OPHTHALMIC | 0 refills | Status: AC

## 2023-11-15 NOTE — ED Triage Notes (Signed)
 Patient reports redness and drainage from both eyes that started yesterday.  Patient saw her eye doctor 2 days ago.

## 2023-11-15 NOTE — ED Provider Notes (Signed)
 [facility]    CSN: [id number] Arrival date & time: 11/15/23  1409      History   Chief Complaint Chief Complaint  Patient presents with   Eye Drainage    HPI Meagan Leach is a 53 y.o. female presenting for bilateral eye redness, itching, irritation and yellowish drainage from eyes.  Symptoms began yesterday.  She saw the eye doctor today before that because her eyes were sort of itchy and bothering her.  Her prescription contact lens was upgraded and she tried to put the contact lenses in yesterday and that is when she noticed the irritation and itchiness began again.  She is concerned about a possible bacterial infection.  She would like an antibiotic eyedrop.  Is currently wearing glasses.  HPI  Past Medical History:  Diagnosis Date   Endometriosis     There are no active problems to display for this patient.   Past Surgical History:  Procedure Laterality Date   CESAREAN SECTION      OB History   No obstetric history on file.      Home Medications    Prior to Admission medications   Medication Sig Start Date End Date Taking? Authorizing Provider  moxifloxacin  (VIGAMOX ) 0.5 % ophthalmic solution Place 1 drop into both eyes 3 (three) times daily for 7 days. 11/15/23 11/22/23 Yes Arvis Huxley B, PA-C  spironolactone  (ALDACTONE ) 100 MG tablet Take 1 tablet (100 mg total) by mouth daily. 01/06/23  Yes Jackquline Sawyer, MD  albuterol  (VENTOLIN  HFA) 108 (90 Base) MCG/ACT inhaler Inhale 1-2 puffs into the lungs every 6 (six) hours as needed for wheezing or shortness of breath. 10/01/23   Mayer, Jodi R, NP  benzonatate  (TESSALON ) 200 MG capsule Take 1 capsule (200 mg total) by mouth 3 (three) times daily as needed. 10/01/23   Mayer, Jodi R, NP  clindamycin -benzoyl peroxide (BENZACLIN) gel Apply topically as directed. Qd to bid to face for acne 01/06/23   Jackquline Sawyer, MD  metoCLOPramide  (REGLAN ) 10 MG tablet Take 1 tablet (10 mg total) by mouth 4 (four) times daily  -  before meals and at bedtime. 01/17/19 01/17/20  Saunders Shona CROME, PA-C  spironolactone  (ALDACTONE ) 50 MG tablet Take 50 mg by mouth 2 (two) times daily. Patient not taking: Reported on 01/06/2023    [provider]  tobramycin -dexamethasone  (TOBRADEX ) ophthalmic solution Place 2 drops into the right eye every 6 (six) hours. 05/14/23   Bernardino Ditch, NP    Family History History reviewed. No pertinent family history.  Social History Social History   Tobacco Use   Smoking status: Never   Smokeless tobacco: Never  Vaping Use   Vaping status: Never Used  Substance Use Topics   Alcohol use: Yes    Comment: seldom   Drug use: Never     Allergies   Penicillins   Review of Systems Review of Systems  Constitutional:  Negative for fatigue and fever.  Eyes:  Positive for discharge, redness and itching. Negative for photophobia, pain and visual disturbance.  Neurological:  Negative for dizziness and headaches.     Physical Exam Triage Vital Signs ED Triage Vitals  Encounter Vitals Group     BP 11/15/23 1522 (!) 141/91     Systolic BP Percentile --      Diastolic BP Percentile --      Pulse Rate 11/15/23 1522 64     Resp 11/15/23 1522 14     Temp 11/15/23 1522 98.4 F (36.9 C)  Temp Source 11/15/23 1522 Oral     SpO2 11/15/23 1522 99 %     Weight 11/15/23 1520 145 lb (65.8 kg)     Height 11/15/23 1520 5' 1 (1.549 m)     Head Circumference --      Peak Flow --      Pain Score 11/15/23 1520 5     Pain Loc --      Pain Education --      Exclude from Growth Chart --    No data found.  Updated Vital Signs BP (!) 141/91 (BP Location: Left Arm)   Pulse 64   Temp 98.4 F (36.9 C) (Oral)   Resp 14   Ht 5' 1 (1.549 m)   Wt 145 lb (65.8 kg)   LMP 11/08/2023 (Approximate)   SpO2 99%   BMI 27.40 kg/m   Visual Acuity Right Eye Distance: 20/50 corrected Left Eye Distance: 20/50 corrected Bilateral Distance: 20/50 corrected      Physical Exam Vitals and  nursing note reviewed.  Constitutional:      General: She is not in acute distress.    Appearance: Normal appearance. She is not ill-appearing or toxic-appearing.  HENT:     Head: Normocephalic and atraumatic.  Eyes:     General: No scleral icterus.       Right eye: No discharge.        Left eye: No discharge.     Conjunctiva/sclera:     Right eye: Right conjunctiva is injected.     Left eye: Left conjunctiva is injected.  Cardiovascular:     Rate and Rhythm: Normal rate.  Pulmonary:     Effort: Pulmonary effort is normal. No respiratory distress.  Musculoskeletal:     Cervical back: Neck supple.  Skin:    General: Skin is dry.  Neurological:     General: No focal deficit present.     Mental Status: She is alert. Mental status is at baseline.     Motor: No weakness.     Gait: Gait normal.  Psychiatric:        Mood and Affect: Mood normal.      UC Treatments / Results  Labs (all labs ordered are listed, but only abnormal results are displayed) Labs Reviewed - No data to display  EKG   Radiology No results found.  Procedures Procedures (including critical care time)  Medications Ordered in UC Medications - No data to display  Initial Impression / Assessment and Plan / UC Course  I have reviewed the triage vital signs and the nursing notes.  Pertinent labs & imaging results that were available during my care of the patient were reviewed by me and considered in my medical decision making (see chart for details).   53 year old female presents for bilateral eye redness, irritation, itching and drainage.  On exam she has diffuse conjunctival injection without drainage currently.  Suspect irritant conjunctivitis versus bacterial conjunctivitis. Is a contact lens wear.  Sent Vigamox  to pharmacy.  Supportive care with over-the-counter redness relief eyedrops, cool compresses.  Reviewed return precautions.   Final Clinical Impressions(s) / UC Diagnoses   Final  diagnoses:  Acute bacterial conjunctivitis of both eyes   Discharge Instructions   None    ED Prescriptions     Medication Sig Dispense Auth. Provider   moxifloxacin  (VIGAMOX ) 0.5 % ophthalmic solution Place 1 drop into both eyes 3 (three) times daily for 7 days. 3 mL Arvis Jolan NOVAK, PA-C  PDMP not reviewed this encounter.   Arvis Jolan NOVAK, PA-C 11/15/23 [phone]

## 2024-01-12 ENCOUNTER — Ambulatory Visit: Payer: No Typology Code available for payment source | Admitting: Dermatology

## 2024-01-12 DIAGNOSIS — L700 Acne vulgaris: Secondary | ICD-10-CM | POA: Diagnosis not present

## 2024-01-12 DIAGNOSIS — Z79899 Other long term (current) drug therapy: Secondary | ICD-10-CM

## 2024-01-12 MED ORDER — SPIRONOLACTONE 100 MG PO TABS
100.0000 mg | ORAL_TABLET | Freq: Every day | ORAL | 3 refills | Status: AC
Start: 2024-01-12 — End: ?

## 2024-01-12 MED ORDER — CLINDAMYCIN PHOS-BENZOYL PEROX 1-5 % EX GEL
CUTANEOUS | 6 refills | Status: AC
Start: 2024-01-12 — End: ?

## 2024-01-12 NOTE — Progress Notes (Signed)
   Follow-Up Visit   Subjective  Meagan Leach is a 53 y.o. female who presents for the following: Acne Vulgaris of the face. She is contolled taking Spironolactone 100 MG daily and using clindamycin - benzoyl peroxide, as needed.  Only occasional breakouts on chin   The following portions of the chart were reviewed this encounter and updated as appropriate: medications, allergies, medical history  Review of Systems:  No other skin or systemic complaints except as noted in HPI or Assessment and Plan.  Objective  Well appearing patient in no apparent distress; mood and affect are within normal limits.  Areas Examined: Face, chest and back  Relevant exam findings are noted in the Assessment and Plan.   Assessment & Plan   ACNE VULGARIS   Related Medications spironolactone (ALDACTONE) 100 MG tablet Take 1 tablet (100 mg total) by mouth daily. clindamycin-benzoyl peroxide (BENZACLIN) gel Apply topically as directed. Qd to bid to face for acne ACNE VULGARIS Exam: face clear today  Chronic condition with duration or expected duration over one year. Currently well-controlled.  Treatment Plan: BP 141/88 Continue spironolactone 100 mg tablet by mouth dsp #90 3 Rf.   Continue clindamycin - benzoyl peroxide (Benzaclin) gel qd prn flares as directed   Spironolactone can cause increased urination and cause blood pressure to decrease. Please watch for signs of lightheadedness and be cautious when changing position. It can sometimes cause breast tenderness or an irregular period in premenopausal women. It can also increase potassium. The increase in potassium usually is not a concern unless you are taking other medicines that also increase potassium, so please be sure your doctor knows all of the other medications you are taking. This medication should not be taken by pregnant women.  This medicine should also not be taken together with sulfa drugs like Bactrim  (trimethoprim/sulfamethexazole).   Benzoyl peroxide can cause dryness and irritation of the skin. It can also bleach fabric. When used together with Aczone (dapsone) cream, it can stain the skin orange.   Return in about 1 year (around 01/11/2025) for Acne.  ICherlyn Labella, CMA, am acting as scribe for Willeen Niece, MD .   Documentation: I have reviewed the above documentation for accuracy and completeness, and I agree with the above.  Willeen Niece, MD

## 2024-01-12 NOTE — Patient Instructions (Signed)

## 2024-10-15 ENCOUNTER — Encounter: Payer: Self-pay | Admitting: Emergency Medicine

## 2024-10-15 ENCOUNTER — Ambulatory Visit

## 2024-10-15 ENCOUNTER — Ambulatory Visit
Admission: EM | Admit: 2024-10-15 | Discharge: 2024-10-15 | Disposition: A | Discharge status: Home / Self Care | Attending: Physician Assistant | Admitting: Physician Assistant

## 2024-10-15 ENCOUNTER — Ambulatory Visit: Payer: Self-pay | Admitting: Physician Assistant

## 2024-10-15 DIAGNOSIS — R509 Fever, unspecified: Secondary | ICD-10-CM

## 2024-10-15 DIAGNOSIS — J029 Acute pharyngitis, unspecified: Secondary | ICD-10-CM

## 2024-10-15 DIAGNOSIS — J020 Streptococcal pharyngitis: Secondary | ICD-10-CM | POA: Diagnosis not present

## 2024-10-15 DIAGNOSIS — R058 Other specified cough: Secondary | ICD-10-CM

## 2024-10-15 DIAGNOSIS — R062 Wheezing: Secondary | ICD-10-CM

## 2024-10-15 DIAGNOSIS — J209 Acute bronchitis, unspecified: Secondary | ICD-10-CM

## 2024-10-15 LAB — POCT RAPID STREP A (OFFICE): Rapid Strep A Screen: POSITIVE — AB

## 2024-10-15 MED ORDER — PREDNISONE 20 MG PO TABS: 40.0000 mg | ORAL_TABLET | Freq: Every day | ORAL | 0 refills | Status: AC

## 2024-10-15 MED ORDER — AZITHROMYCIN 250 MG PO TABS: 250.0000 mg | ORAL_TABLET | Freq: Every day | ORAL | 0 refills | Status: AC

## 2024-10-15 MED ORDER — LIDOCAINE VISCOUS HCL 2 % MT SOLN: 15.0000 mL | OROMUCOSAL | 0 refills | Status: AC | PRN

## 2024-10-15 MED ORDER — PROMETHAZINE-DM 6.25-15 MG/5ML PO SYRP: 5.0000 mL | ORAL_SOLUTION | Freq: Four times a day (QID) | ORAL | 0 refills | Status: AC | PRN

## 2024-10-15 NOTE — ED Provider Notes (Signed)
 [facility]    CSN: [id number] Arrival date & time: 10/15/24  0810      History   Chief Complaint Chief Complaint  Patient presents with   Sore Throat    HPI Meagan Leach is a 53 y.o. female presenting for 13 day history of nasal congestion, cough, headache, nasal pain, nausea, postnasal drip, runny nose, sinus pain and sore throat. Denies myalgias chills, no appetite change, diarrhea, chest pain, shortness of breath. Reports wheezing.  Her sputum is blood-streaked today.  Has had some nausea with vomiting and dry heaving.  Patient had an e-visit 2 days ago and was prescribed doxycycline since she has an allergy to penicillin.  Sore throat worsened over the past 2 days and she has had fevers up to 102 degrees. She is concerned for possible strep throat at this time. Also taking OTC meds.   HPI  Past Medical History:  Diagnosis Date   Endometriosis     There are no active problems to display for this patient.   Past Surgical History:  Procedure Laterality Date   CESAREAN SECTION      OB History   No obstetric history on file.      Home Medications    Prior to Admission medications   Medication Sig Start Date End Date Taking? Authorizing Provider  azithromycin  (ZITHROMAX ) 250 MG tablet Take 1 tablet (250 mg total) by mouth daily. Take first 2 tablets together, then 1 every day until finished. 10/15/24  Yes Arvis Huxley B, PA-C  lidocaine  (XYLOCAINE ) 2 % solution Use as directed 15 mLs in the mouth or throat every 3 (three) hours as needed for mouth pain (swish and spit). 10/15/24  Yes Arvis Huxley B, PA-C  predniSONE  (DELTASONE ) 20 MG tablet Take 2 tablets (40 mg total) by mouth daily for 5 days. 10/15/24 10/20/24 Yes Arvis Huxley B, PA-C  promethazine -dextromethorphan (PROMETHAZINE -DM) 6.25-15 MG/5ML syrup Take 5 mLs by mouth 4 (four) times daily as needed. 10/15/24  Yes Arvis Huxley NOVAK, PA-C  spironolactone  (ALDACTONE ) 100 MG tablet Take 1 tablet (100  mg total) by mouth daily. 01/12/24  Yes Jackquline Sawyer, MD  albuterol  (VENTOLIN  HFA) [phone] Base) MCG/ACT inhaler Inhale 1-2 puffs into the lungs every 6 (six) hours as needed for wheezing or shortness of breath. 10/01/23   Mayer, Jodi R, NP  clindamycin -benzoyl peroxide (BENZACLIN) gel Apply topically as directed. Qd to bid to face for acne 01/12/24   Jackquline Sawyer, MD  metoCLOPramide  (REGLAN ) 10 MG tablet Take 1 tablet (10 mg total) by mouth 4 (four) times daily -  before meals and at bedtime. 01/17/19 01/17/20  Saunders Shona CROME, PA-C    Family History History reviewed. No pertinent family history.  Social History Social History   Tobacco Use   Smoking status: Never   Smokeless tobacco: Never  Vaping Use   Vaping status: Never Used  Substance Use Topics   Alcohol use: Yes    Comment: seldom   Drug use: Never     Allergies   Penicillins   Review of Systems Review of Systems  Constitutional:  Positive for fatigue and fever. Negative for chills and diaphoresis.  HENT:  Positive for congestion, postnasal drip, rhinorrhea, sinus pressure, sinus pain and sore throat. Negative for ear pain.   Respiratory:  Positive for cough and wheezing. Negative for shortness of breath.   Cardiovascular:  Negative for chest pain.  Gastrointestinal:  Positive for nausea and vomiting. Negative for abdominal pain and diarrhea.  Musculoskeletal:  Negative for arthralgias and myalgias.  Skin:  Negative for rash.  Neurological:  Negative for weakness and headaches.  Hematological:  Negative for adenopathy.     Physical Exam Triage Vital Signs ED Triage Vitals  Encounter Vitals Group     BP      Girls Systolic BP Percentile      Girls Diastolic BP Percentile      Boys Systolic BP Percentile      Boys Diastolic BP Percentile      Pulse      Resp      Temp      Temp src      SpO2      Weight      Height      Head Circumference      Peak Flow      Pain Score      Pain Loc      Pain Education       Exclude from Growth Chart    No data found.  Updated Vital Signs BP 127/86 (BP Location: Right Arm)   Pulse 77   Temp 98.1 F (36.7 C) (Oral)   Resp 14   Ht 5' 1 (1.549 m)   Wt 145 lb 1 oz (65.8 kg)   LMP 09/21/2024 (Approximate)   SpO2 96%   BMI 27.41 kg/m   Physical Exam Vitals and nursing note reviewed.  Constitutional:      General: She is not in acute distress.    Appearance: Normal appearance. She is ill-appearing. She is not toxic-appearing.  HENT:     Head: Normocephalic and atraumatic.     Right Ear: Tympanic membrane, ear canal and external ear normal.     Left Ear: Tympanic membrane, ear canal and external ear normal.     Nose: Congestion present.     Mouth/Throat:     Mouth: Mucous membranes are moist.     Pharynx: Oropharynx is clear. Posterior oropharyngeal erythema present.  Eyes:     General: No scleral icterus.       Right eye: No discharge.        Left eye: No discharge.     Conjunctiva/sclera: Conjunctivae normal.  Cardiovascular:     Rate and Rhythm: Normal rate and regular rhythm.     Heart sounds: Normal heart sounds.  Pulmonary:     Effort: Pulmonary effort is normal. No respiratory distress.     Breath sounds: Wheezing present.     Comments: Reduced breath sounds throughout with wheezes of the left lung base. Musculoskeletal:     Cervical back: Neck supple.  Skin:    General: Skin is dry.  Neurological:     General: No focal deficit present.     Mental Status: She is alert. Mental status is at baseline.     Motor: No weakness.     Gait: Gait normal.  Psychiatric:        Mood and Affect: Mood normal.        Behavior: Behavior normal.      UC Treatments / Results  Labs (all labs ordered are listed, but only abnormal results are displayed) Labs Reviewed  POCT RAPID STREP A (OFFICE) - Abnormal; Notable for the following components:      Result Value   Rapid Strep A Screen Positive (*)    All other components within normal  limits    EKG   Radiology DG Chest 2 View Result Date: 10/15/2024 EXAM: 2 VIEW(S) XRAY OF  THE CHEST 10/15/2024 08:48:00 AM COMPARISON: 10/01/2023 CLINICAL HISTORY: cough, congestion and wheezing 2 weeks FINDINGS: LUNGS AND PLEURA: No focal pulmonary opacity. No pleural effusion. No pneumothorax. HEART AND MEDIASTINUM: No acute abnormality of the cardiac and mediastinal silhouettes. BONES AND SOFT TISSUES: Unremarkable visualized skeletal structures except for minor thoracic dextrocurvature. IMPRESSION: 1. No acute cardiopulmonary process. 2. Minor thoracic dextrocurvature. Electronically signed by: Ryan Salvage MD 10/15/2024 09:14 AM EST RP Workstation: [id number]    Procedures Procedures (including critical care time)  Medications Ordered in UC Medications - No data to display  Initial Impression / Assessment and Plan / UC Course  I have reviewed the triage vital signs and the nursing notes.  Pertinent labs & imaging results that were available during my care of the patient were reviewed by me and considered in my medical decision making (see chart for details).   53 year old female presents for sore throat that has been worsening over the past couple days.  Fevers up to 102 degrees over the past 2 days.  She had an e-visit 2 days ago started on doxycycline for sinusitis after reporting 11-day history of cough, congestion, postnasal drainage and sinus pain at the visit 2 days ago.  Medication has seemed to help nasal congestion but sore throat worsened.  Vitals are stable and normal.  Patient ill-appearing but nontoxic.  On exam no evidence of ear infection.  She has nasal congestion, erythema posterior pharynx.  Reduced breath sounds throughout all lung fields with wheezes of left lung base.  Rapid strep test performed.  Positive.  Will obtain chest x-ray to also evaluate for pneumonia.  Explained to patient I would like to ensure that she is on the appropriate medication.  If  x-ray is clear for pneumonia will treat strep throat with azithromycin  as she avoids penicillin and all cephalosporin drugs.  If evidence of pneumonia on the x-ray we will treat with azithromycin  plus Levaquin.  Chest x-ray negative.  Reviewed with patient.  If radiologist reads imaging differently and sees pneumonia will add Levaquin.  Discussed this with patient.  Strep pharyngitis.  Discontinue doxycycline and begin azithromycin .  Encouraged increasing rest and fluids.  Also sent viscous lidocaine .  Promethazine  sent for cough.  Prednisone  sent for acute bronchitis and wheezing.  Thoroughly reviewed return and ER precautions.  CXR over read negative. No change to treatment plan.   Acute illness with systemic symptoms.   Final Clinical Impressions(s) / UC Diagnoses   Final diagnoses:  Sore throat  Productive cough  Strep pharyngitis  Fever, unspecified  Wheezing  Acute bronchitis, unspecified organism     Discharge Instructions      - Your strep test was positive. - Discontinue the doxycycline and start azithromycin . - I did not see pneumonia on your chest x-ray but if the radiologist does I will contact you and send another antibiotic. - I did send prednisone  to help with the wheezing.  I also sent cough medicine.  Increase your rest and fluids. - If fever is not breaking within the next 2 to 3 days you should be seen again. - Go to the ER if it any point your symptoms are worsening.     ED Prescriptions     Medication Sig Dispense Auth. Provider   azithromycin  (ZITHROMAX ) 250 MG tablet Take 1 tablet (250 mg total) by mouth daily. Take first 2 tablets together, then 1 every day until finished. 6 tablet Arvis Huxley B, PA-C   predniSONE  (DELTASONE ) 20 MG tablet Take 2  tablets (40 mg total) by mouth daily for 5 days. 10 tablet Arvis Huxley B, PA-C   lidocaine  (XYLOCAINE ) 2 % solution Use as directed 15 mLs in the mouth or throat every 3 (three) hours as needed for mouth  pain (swish and spit). 100 mL Arvis Huxley B, PA-C   promethazine -dextromethorphan (PROMETHAZINE -DM) 6.25-15 MG/5ML syrup Take 5 mLs by mouth 4 (four) times daily as needed. 118 mL Arvis Huxley NOVAK, PA-C      PDMP not reviewed this encounter.   Arvis Huxley NOVAK, PA-C 10/15/24 [phone]

## 2024-10-15 NOTE — ED Triage Notes (Addendum)
 Patient c/o sore throat that started 2 days ago.  [facility] and was given an antibiotic for a sinus infection.  Patient is currently on Doxycycline.  Patient states that the next day she developed a sore throat and fever.  Patient thinks that she might have strep throat.  Patient declined COVID and Flu tests

## 2024-10-15 NOTE — Discharge Instructions (Signed)
-   Your strep test was positive. - Discontinue the doxycycline and start azithromycin . - I did not see pneumonia on your chest x-ray but if the radiologist does I will contact you and send another antibiotic. - I did send prednisone  to help with the wheezing.  I also sent cough medicine.  Increase your rest and fluids. - If fever is not breaking within the next 2 to 3 days you should be seen again. - Go to the ER if it any point your symptoms are worsening.

## 2025-01-17 ENCOUNTER — Ambulatory Visit: Admitting: Dermatology
